# Patient Record
Sex: Female | Born: 1960 | Race: White | Hispanic: No | Marital: Married | Smoking: Never smoker
Health system: Southern US, Community
[De-identification: ages and names within clinical notes are randomized; demographics above are authoritative.]

## PROBLEM LIST (undated history)

## (undated) DIAGNOSIS — D869 Sarcoidosis, unspecified: Secondary | ICD-10-CM

## (undated) DIAGNOSIS — M797 Fibromyalgia: Secondary | ICD-10-CM

## (undated) DIAGNOSIS — Z8739 Personal history of other diseases of the musculoskeletal system and connective tissue: Secondary | ICD-10-CM

## (undated) DIAGNOSIS — N938 Other specified abnormal uterine and vaginal bleeding: Secondary | ICD-10-CM

## (undated) DIAGNOSIS — N870 Mild cervical dysplasia: Secondary | ICD-10-CM

## (undated) DIAGNOSIS — L409 Psoriasis, unspecified: Secondary | ICD-10-CM

## (undated) DIAGNOSIS — N921 Excessive and frequent menstruation with irregular cycle: Secondary | ICD-10-CM

## (undated) DIAGNOSIS — I889 Nonspecific lymphadenitis, unspecified: Secondary | ICD-10-CM

## (undated) DIAGNOSIS — Z332 Encounter for elective termination of pregnancy: Secondary | ICD-10-CM

## (undated) HISTORY — PX: PELVIC LAPAROSCOPY: SHX162

## (undated) HISTORY — DX: Encounter for elective termination of pregnancy: Z33.2

## (undated) HISTORY — DX: Excessive and frequent menstruation with irregular cycle: N92.1

## (undated) HISTORY — DX: Psoriasis, unspecified: L40.9

## (undated) HISTORY — PX: MOUTH SURGERY: SHX715

## (undated) HISTORY — DX: Other specified abnormal uterine and vaginal bleeding: N93.8

## (undated) HISTORY — DX: Nonspecific lymphadenitis, unspecified: I88.9

## (undated) HISTORY — PX: CRYOABLATION: SHX1415

## (undated) HISTORY — DX: Mild cervical dysplasia: N87.0

## (undated) HISTORY — DX: Personal history of other diseases of the musculoskeletal system and connective tissue: Z87.39

---

## 2000-02-01 ENCOUNTER — Encounter: Admission: RE | Admit: 2000-02-01 | Discharge: 2000-02-01 | Payer: Self-pay | Admitting: Family Medicine

## 2000-02-01 ENCOUNTER — Encounter: Payer: Self-pay | Admitting: Family Medicine

## 2001-12-17 ENCOUNTER — Encounter: Payer: Self-pay | Admitting: Family Medicine

## 2001-12-17 ENCOUNTER — Encounter: Admission: RE | Admit: 2001-12-17 | Discharge: 2001-12-17 | Payer: Self-pay | Admitting: Family Medicine

## 2005-06-04 ENCOUNTER — Encounter: Admission: RE | Admit: 2005-06-04 | Discharge: 2005-06-04 | Payer: Self-pay | Admitting: Family Medicine

## 2007-01-06 ENCOUNTER — Encounter: Admission: RE | Admit: 2007-01-06 | Discharge: 2007-01-06 | Payer: Self-pay | Admitting: Nurse Practitioner

## 2008-02-18 ENCOUNTER — Encounter: Admission: RE | Admit: 2008-02-18 | Discharge: 2008-02-18 | Payer: Self-pay | Admitting: Family Medicine

## 2010-01-10 ENCOUNTER — Other Ambulatory Visit: Admission: RE | Admit: 2010-01-10 | Discharge: 2010-01-10 | Payer: Self-pay | Admitting: Gynecology

## 2010-01-10 ENCOUNTER — Ambulatory Visit: Payer: Self-pay | Admitting: Gynecology

## 2010-01-18 ENCOUNTER — Ambulatory Visit: Payer: Self-pay | Admitting: Gynecology

## 2010-01-19 ENCOUNTER — Ambulatory Visit: Payer: Self-pay | Admitting: Gynecology

## 2010-03-06 ENCOUNTER — Encounter: Admission: RE | Admit: 2010-03-06 | Discharge: 2010-03-06 | Payer: Self-pay | Admitting: Family Medicine

## 2010-03-23 ENCOUNTER — Encounter: Admission: RE | Admit: 2010-03-23 | Discharge: 2010-03-23 | Payer: Self-pay | Admitting: Family Medicine

## 2010-03-30 ENCOUNTER — Ambulatory Visit: Payer: Self-pay | Admitting: Gynecology

## 2010-05-31 ENCOUNTER — Ambulatory Visit
Admission: RE | Admit: 2010-05-31 | Discharge: 2010-05-31 | Payer: Self-pay | Source: Home / Self Care | Attending: Gynecology | Admitting: Gynecology

## 2010-06-01 ENCOUNTER — Ambulatory Visit
Admission: RE | Admit: 2010-06-01 | Discharge: 2010-06-01 | Payer: Self-pay | Source: Home / Self Care | Attending: Gynecology | Admitting: Gynecology

## 2010-06-01 HISTORY — PX: ENDOMETRIAL ABLATION: SHX621

## 2010-06-10 ENCOUNTER — Encounter: Payer: Self-pay | Admitting: Nurse Practitioner

## 2010-06-19 ENCOUNTER — Ambulatory Visit
Admission: RE | Admit: 2010-06-19 | Discharge: 2010-06-19 | Payer: Self-pay | Source: Home / Self Care | Attending: Gynecology | Admitting: Gynecology

## 2010-07-31 ENCOUNTER — Ambulatory Visit (INDEPENDENT_AMBULATORY_CARE_PROVIDER_SITE_OTHER): Payer: BC Managed Care – PPO | Admitting: Gynecology

## 2010-07-31 DIAGNOSIS — N925 Other specified irregular menstruation: Secondary | ICD-10-CM

## 2010-07-31 DIAGNOSIS — N949 Unspecified condition associated with female genital organs and menstrual cycle: Secondary | ICD-10-CM

## 2010-09-19 ENCOUNTER — Ambulatory Visit
Admission: RE | Admit: 2010-09-19 | Discharge: 2010-09-19 | Disposition: A | Discharge status: Home / Self Care | Payer: BC Managed Care – PPO | Source: Ambulatory Visit | Attending: Family Medicine | Admitting: Family Medicine

## 2010-09-19 ENCOUNTER — Other Ambulatory Visit: Payer: Self-pay | Admitting: Family Medicine

## 2010-09-19 DIAGNOSIS — R202 Paresthesia of skin: Secondary | ICD-10-CM

## 2010-09-19 DIAGNOSIS — R51 Headache: Secondary | ICD-10-CM

## 2010-09-19 DIAGNOSIS — H53129 Transient visual loss, unspecified eye: Secondary | ICD-10-CM

## 2010-09-24 ENCOUNTER — Other Ambulatory Visit: Payer: Self-pay | Admitting: Family Medicine

## 2010-09-24 DIAGNOSIS — R9389 Abnormal findings on diagnostic imaging of other specified body structures: Secondary | ICD-10-CM

## 2010-09-25 ENCOUNTER — Other Ambulatory Visit: Payer: BC Managed Care – PPO

## 2010-09-27 ENCOUNTER — Ambulatory Visit
Admission: RE | Admit: 2010-09-27 | Discharge: 2010-09-27 | Disposition: A | Discharge status: Home / Self Care | Payer: BC Managed Care – PPO | Source: Ambulatory Visit | Attending: Family Medicine | Admitting: Family Medicine

## 2010-09-27 DIAGNOSIS — R9389 Abnormal findings on diagnostic imaging of other specified body structures: Secondary | ICD-10-CM

## 2010-10-16 ENCOUNTER — Encounter: Payer: Self-pay | Admitting: Emergency Medicine

## 2010-10-17 ENCOUNTER — Other Ambulatory Visit (INDEPENDENT_AMBULATORY_CARE_PROVIDER_SITE_OTHER): Payer: BC Managed Care – PPO

## 2010-10-17 ENCOUNTER — Ambulatory Visit (INDEPENDENT_AMBULATORY_CARE_PROVIDER_SITE_OTHER): Payer: BC Managed Care – PPO | Admitting: Emergency Medicine

## 2010-10-17 ENCOUNTER — Encounter: Payer: Self-pay | Admitting: Emergency Medicine

## 2010-10-17 DIAGNOSIS — R0609 Other forms of dyspnea: Secondary | ICD-10-CM

## 2010-10-17 DIAGNOSIS — R918 Other nonspecific abnormal finding of lung field: Secondary | ICD-10-CM

## 2010-10-17 DIAGNOSIS — R0989 Other specified symptoms and signs involving the circulatory and respiratory systems: Secondary | ICD-10-CM

## 2010-10-17 DIAGNOSIS — R0600 Dyspnea, unspecified: Secondary | ICD-10-CM

## 2010-10-17 LAB — CBC
HCT: 43.4 % (ref 36.0–46.0)
Hemoglobin: 13.7 g/dL (ref 12.0–15.0)
MCH: 28.5 pg (ref 26.0–34.0)
MCHC: 31.6 g/dL (ref 30.0–36.0)
MCV: 90.4 fL (ref 78.0–100.0)
Platelets: 271 10*3/uL (ref 150–400)
RBC: 4.8 MIL/uL (ref 3.87–5.11)
RDW: 13.7 % (ref 11.5–15.5)
WBC: 11.8 10*3/uL — ABNORMAL HIGH (ref 4.0–10.5)

## 2010-10-17 LAB — PROTIME-INR
INR: 1 ratio (ref 0.8–1.0)
Prothrombin Time: 11.3 s (ref 10.2–12.4)

## 2010-10-17 NOTE — Progress Notes (Signed)
Subjective:     Patient ID: Phyllis Kim, female   DOB: 04/18/1961, 50 y.o.   MRN: [id number]  HPI 50 yo never smoker, hx psoriasis, referred for SOB and abnormal CT scan of the chest. Works as a Runner, broadcasting/film/video.  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 A CXR was done that showed possible nodule, led to CT scan of the chest that shows a calcified R node, other scattered small nodes, some mediastinal LAD. She had a rash that involved her LE and then spread to buttocks and back - treated w pred and improved. She got Israel Pigs in her classroom, ? Related to this. She has water damage in her bathroom with some mold exposure.   Review of Systems  Constitutional: Negative for fever, chills and unexpected weight change.  HENT: Negative for ear pain, nosebleeds, congestion, sore throat, rhinorrhea, sneezing, trouble swallowing, dental problem, voice change, postnasal drip and sinus pressure.   Eyes: Positive for visual disturbance.  Respiratory: Positive for shortness of breath. Negative for cough and choking.   Cardiovascular: Positive for leg swelling. Negative for chest pain.  Gastrointestinal: Negative for vomiting, abdominal pain and diarrhea.  Genitourinary: Negative for difficulty urinating.  Musculoskeletal: Negative for arthralgias.  Skin: Positive for rash.  Neurological: Negative for tremors, syncope and headaches.  Hematological: Does not bruise/bleed easily.   Past Medical History  Diagnosis Date  . Lymphadenitis   . Dysfunctional uterine bleeding   . Menometrorrhagia   . Psoriasis      Family History  Problem Relation Age of Onset  . Emphysema Paternal Grandfather     was a smoker     History   Social History  . Marital Status: Married    Spouse Name: N/A    Number of Children: 3  . Years of Education: N/A   Occupational History  . 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 Smoking status: Never Smoker   . Smokeless tobacco: Never Used  . Alcohol Use: 1.5 - 2.0 oz/week    3-4 drink(s) per week  . Drug Use: Not on file  . Sexually Active: Not on file   Other Topics Concern  . Not on file   Social History Narrative   Lived most of her life in [place], [place]. No known exposure to TB     Allergies  Allergen Reactions  . Amoxicillin      Outpatient Prescriptions Prior to Visit  Medication Sig Dispense Refill  . megestrol (MEGACE) 40 MG tablet Take 40 mg by mouth daily.             Objective:   Physical Exam  Gen: Pleasant, well-nourished, in no distress,  normal affect  ENT: No lesions,  mouth clear,  oropharynx clear, no postnasal drip  Neck: No JVD, no TMG, no carotid bruits  Lungs: No use of accessory muscles, no dullness to percussion, clear without rales or rhonchi  Cardiovascular: RRR, heart sounds normal, no murmur or gallops, no peripheral edema  Abdomen: soft and NT, no HSM,  BS normal  Musculoskeletal: No deformities, no cyanosis or clubbing  Neuro: alert, non focal  Skin: Warm, no lesions or rashes     Assessment:     Pulmonary nodules With mediastinal LAD. The calcifications are reassuring. Could represent sarcoidosis,  HSP, other inflammatory process. The rash and visual changes would fit w sarcoidosis - FOB/EBUS to sample nodes and prob TBBx's - ACE level - serum precipitans - will need optho eval at some point  Dyspnea on exertion Full PFT's

## 2010-10-17 NOTE — Assessment & Plan Note (Signed)
Full PFTs

## 2010-10-17 NOTE — Assessment & Plan Note (Addendum)
With mediastinal LAD. The calcifications are reassuring. Could represent sarcoidosis, HSP, other inflammatory process. The rash and visual changes would fit w sarcoidosis - FOB/EBUS to sample nodes and prob TBBx's - ACE level - serum precipitans - will need optho eval at some point

## 2010-10-17 NOTE — Patient Instructions (Signed)
We will perform an bronchoscopy with ultrasound and biopsies of lung and lymph node tissue We will perform blood work today We will perform full pulmonary function testing Follow up with Dr Delton Coombes next available to discuss results.

## 2010-10-19 LAB — ANGIOTENSIN CONVERTING ENZYME: Angiotensin-Converting Enzyme: 44 U/L (ref 8–52)

## 2010-10-20 LAB — FUNGAL ANTIBODIES PANEL, ID-BLOOD
Aspergillus Flavus Antibodies: NEGATIVE
Aspergillus Niger Antibodies: NEGATIVE
Aspergillus fumigatus: NEGATIVE
Blastomyces Abs, Qn, DID: NEGATIVE
Coccidioides Antibody ID: NEGATIVE
Histoplasma Antibody, ID: NEGATIVE

## 2010-10-22 ENCOUNTER — Telehealth: Payer: Self-pay | Admitting: Emergency Medicine

## 2010-10-22 NOTE — Telephone Encounter (Signed)
Called and spoke with pt.  Pt states she has been waiting on a call from someone regarding directions/instructions for EBUS/FOB.  Informed pt [facility] should call pt to inform her of instructions prior to the procedure.  However, gave pt [facility] # for pt to call tomorrow( they are closed now for the evening)  morning to get instructions. Pt verbalized understanding and stated she will call tomorrow morning.

## 2010-10-24 ENCOUNTER — Other Ambulatory Visit: Payer: Self-pay | Admitting: Emergency Medicine

## 2010-10-24 ENCOUNTER — Ambulatory Visit (HOSPITAL_COMMUNITY): Payer: BC Managed Care – PPO

## 2010-10-24 ENCOUNTER — Ambulatory Visit (HOSPITAL_COMMUNITY)
Admission: RE | Admit: 2010-10-24 | Discharge: 2010-10-24 | Disposition: A | Discharge status: Home / Self Care | Payer: BC Managed Care – PPO | Source: Ambulatory Visit | Attending: Emergency Medicine | Admitting: Emergency Medicine

## 2010-10-24 DIAGNOSIS — R0609 Other forms of dyspnea: Secondary | ICD-10-CM | POA: Insufficient documentation

## 2010-10-24 DIAGNOSIS — R599 Enlarged lymph nodes, unspecified: Secondary | ICD-10-CM | POA: Insufficient documentation

## 2010-10-24 DIAGNOSIS — R911 Solitary pulmonary nodule: Secondary | ICD-10-CM

## 2010-10-24 DIAGNOSIS — J984 Other disorders of lung: Secondary | ICD-10-CM

## 2010-10-24 DIAGNOSIS — L408 Other psoriasis: Secondary | ICD-10-CM | POA: Insufficient documentation

## 2010-10-24 DIAGNOSIS — R0989 Other specified symptoms and signs involving the circulatory and respiratory systems: Secondary | ICD-10-CM | POA: Insufficient documentation

## 2010-10-24 DIAGNOSIS — Z01818 Encounter for other preprocedural examination: Secondary | ICD-10-CM | POA: Insufficient documentation

## 2010-10-24 DIAGNOSIS — Z79899 Other long term (current) drug therapy: Secondary | ICD-10-CM | POA: Insufficient documentation

## 2010-10-24 LAB — CBC
HCT: 37.9 % (ref 36.0–46.0)
Hemoglobin: 12.9 g/dL (ref 12.0–15.0)
MCH: 28.9 pg (ref 26.0–34.0)
MCHC: 34 g/dL (ref 30.0–36.0)
MCV: 85 fL (ref 78.0–100.0)
Platelets: 228 10*3/uL (ref 150–400)
RBC: 4.46 MIL/uL (ref 3.87–5.11)
RDW: 13.1 % (ref 11.5–15.5)
WBC: 8.2 10*3/uL (ref 4.0–10.5)

## 2010-10-24 LAB — PROTIME-INR
INR: 1.03 (ref 0.00–1.49)
Prothrombin Time: 13.7 seconds (ref 11.6–15.2)

## 2010-10-24 LAB — SURGICAL PCR SCREEN
MRSA, PCR: NEGATIVE
Staphylococcus aureus: NEGATIVE

## 2010-10-24 LAB — APTT: aPTT: 28 seconds (ref 24–37)

## 2010-10-26 ENCOUNTER — Telehealth: Payer: Self-pay | Admitting: Emergency Medicine

## 2010-10-26 LAB — CULTURE, BAL-QUANTITATIVE W GRAM STAIN
Colony Count: NO GROWTH
Culture: NO GROWTH
Gram Stain: NONE SEEN

## 2010-10-26 NOTE — Telephone Encounter (Signed)
Spoke with pt. She is asking for bx results. I advised that MR out of the office until 6/13 and will forward him msg to let him know she is requesting results. Please advise thanks!

## 2010-10-29 NOTE — Telephone Encounter (Signed)
Spoke to patient, reviewed path and cytology - consistent w granulomatous inflammation, no malignancy. I suspect that this will be either old fungal disease or sarcoidosis. Explained the Ddx to her. We will review cx data in July at [facility]

## 2010-11-26 LAB — FUNGUS CULTURE W SMEAR
Fungal Smear: NONE SEEN
Fungal Smear: NONE SEEN

## 2010-11-27 NOTE — Assessment & Plan Note (Signed)
[facility]                                ON-CALL NOTE  NAME:PECKLinday, Rhodes                         MRN:          [id number] DATE:10/24/2010                            DOB:          August 27, 1960   Ms. Splinter called the on-call line to relate that after eating a bowl of ice cream and then going to bed, she woke up with a headache and threw- up.  Otherwise, she is not feeling any other symptoms.  She denies any abdominal pain, any continued nausea, just feels weak.  She claims she had a fiberoptic bronchoscopy this morning.  She could not remember the sedation and otherwise has no other symptoms particularly fever, chills, sweats, orthostasis.  I instructed that this is not that unusual.  She may have had a transient bacteremia or reaction to the medication.  If she has any other symptoms, call back.  Otherwise, she could report to the emergency room if she has any worsening of her condition.  I will relate this to Dr. Delton Coombes.    Rogelia Rohrer, MD Electronically Signed   Clemetine Marker  DD: 10/24/2010  DT: 10/25/2010  Job #: [id number]

## 2010-12-05 ENCOUNTER — Ambulatory Visit (INDEPENDENT_AMBULATORY_CARE_PROVIDER_SITE_OTHER): Payer: BC Managed Care – PPO | Admitting: Emergency Medicine

## 2010-12-05 ENCOUNTER — Encounter: Payer: Self-pay | Admitting: Emergency Medicine

## 2010-12-05 VITALS — BP 110/68 | HR 92 | Temp 97.7°F | Ht 66.0 in | Wt 191.0 lb

## 2010-12-05 DIAGNOSIS — R0600 Dyspnea, unspecified: Secondary | ICD-10-CM

## 2010-12-05 DIAGNOSIS — R918 Other nonspecific abnormal finding of lung field: Secondary | ICD-10-CM

## 2010-12-05 DIAGNOSIS — R0609 Other forms of dyspnea: Secondary | ICD-10-CM

## 2010-12-05 DIAGNOSIS — R0989 Other specified symptoms and signs involving the circulatory and respiratory systems: Secondary | ICD-10-CM

## 2010-12-05 LAB — PULMONARY FUNCTION TEST

## 2010-12-05 NOTE — Progress Notes (Signed)
Subjective:     Patient ID: Phyllis Kim, female   DOB: 11/01/1960, 50 y.o.   MRN: [id number]  HPI 50 yo never smoker, hx psoriasis, referred for SOB and abnormal CT scan of the chest. Works as a Runner, broadcasting/film/video.  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 A CXR was done that showed possible nodule, led to CT scan of the chest that shows a calcified R node, other scattered small nodes, some mediastinal LAD. She had a rash that involved her LE and then spread to buttocks and back - treated w pred and improved. She got Israel Pigs in her classroom, ? Related to this. She has water damage in her bathroom with some mold exposure.   ROV 12/05/10 -- follows up post FOB. Wang bx show granulomas, cx's all negative so far. PFT done today - Normal airflows, Normal TLC, DLCO corrects to normal range. She has been doing well, no complaints, no rash, no breathing difficulties.   Review of Systems  Constitutional: Negative for fever, chills and unexpected weight change.  HENT: Negative for ear pain, nosebleeds, congestion, sore throat, rhinorrhea, sneezing, trouble swallowing, dental problem, voice change, postnasal drip and sinus pressure.   Eyes: Positive for visual disturbance.  Respiratory: Positive for shortness of breath. Negative for cough and choking.   Cardiovascular: Positive for leg swelling. Negative for chest pain.  Gastrointestinal: Negative for vomiting, abdominal pain and diarrhea.  Genitourinary: Negative for difficulty urinating.  Musculoskeletal: Negative for arthralgias.  Skin: Positive for rash.  Neurological: Negative for tremors, syncope and headaches.  Hematological: Does not bruise/bleed easily.   Past Medical History  Diagnosis Date  . Lymphadenitis   . Dysfunctional uterine bleeding   . Menometrorrhagia   . Psoriasis      Family History  Problem Relation Age of Onset  . Emphysema Paternal Grandfather     was  a smoker     History   Social History  . Marital Status: Married    Spouse Name: N/A    Number of Children: 3  . Years of Education: N/A   Occupational History  . 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 Smoking status: Never Smoker   . Smokeless tobacco: Never Used  . Alcohol Use: 1.5 - 2.0 oz/week    3-4 drink(s) per week  . Drug Use: Not on file  . Sexually Active: Not on file   Other Topics Concern  . Not on file   Social History Narrative   Lived most of [place] life in [place], [place]. No known exposure to TB     Allergies  Allergen Reactions  . Amoxicillin      Outpatient Prescriptions Prior to Visit  Medication Sig Dispense Refill  . megestrol (MEGACE) 40 MG tablet Take 40 mg by mouth daily.             Objective:   Physical Exam  Gen: Pleasant, well-nourished, in no distress,  normal affect  ENT: No lesions,  mouth clear,  oropharynx clear, no postnasal drip  Neck: No JVD, no TMG, no carotid bruits  Lungs: No use of accessory muscles, no dullness to percussion, clear without rales or rhonchi  Cardiovascular: RRR, heart sounds normal, no murmur or gallops, no peripheral edema  Abdomen: soft and NT, no HSM,  BS normal  Musculoskeletal: No deformities, no cyanosis or clubbing  Neuro: alert, non focal  Skin: Warm, no lesions or rashes     Assessment:     Pulmonary nodules With mediastinal LAD. The calcifications are reassuring. Could represent sarcoidosis, HSP, other inflammatory process. The rash and visual changes would fit w sarcoidosis - FOB/EBUS to sample nodes and prob TBBx's - ACE level - serum precipitans - will need optho eval at some point  Dyspnea on exertion Full PFT's

## 2010-12-05 NOTE — Patient Instructions (Signed)
Your biopsies show evidence for sarcoidosis Your cultures are all negative Please follow up with Dr Delton Coombes in 6 months with a CXR, or sooner if you have any new problems.  Mention sarcoidosis to your opthomolgist.

## 2010-12-05 NOTE — Op Note (Signed)
NAMECAMYA, HAYDON NO.:  [id number]  MEDICAL RECORD NO.:  [id number]  LOCATION:  XRAY                         FACILITY:  [facility]  PHYSICIAN:  Leslye Peer, MD    DATE OF BIRTH:  Nov 23, 1960  DATE OF PROCEDURE:  10/24/2010 DATE OF DISCHARGE:  10/24/2010                              OPERATIVE REPORT   PROCEDURE:  Video fiberoptic bronchoscopy with endobronchial ultrasound, needle biopsies, and transbronchial biopsies.  OPERATOR:  Leslye Peer, MD  INDICATION:  Mediastinal lymphadenopathy and pulmonary nodules.  MEDICATIONS GIVEN:  The procedure was done under general anesthesia with endotracheal intubation.  CONSENT:  Informed consent was obtained from the patient and a signed copy was on her hospital chart.  PROCEDURE DETAILS:  After informed consent was obtained, the patient was taken to the operating room and general anesthesia was initiated with endotracheal intubation.  A formal time-out was performed and then a standard bronchoscope was introduced to the patient's endotracheal tube to visualize the airways.  The main carina was sharp without any abnormality and airway inspection of the right showed normal right mainstem bronchus.  The right upper lobe was normal in appearance with good visualization of the segmental bronchi.  The bronchus intermedius, right middle lobe, and right lower lobe were all similarly within normal limits.  There were no endobronchial lesions or abnormal secretions seen.  Attention was then turned to the left-sided exam.  The left mainstem, left upper lobe lingular, and left lower lobe airways were all inspected.  Again these were all completely normal in appearance without any endobronchial lesions or abnormal secretions.  The standard bronchoscope was removed and endobronchial ultrasound was introduced. Using real time ultrasound guidance, we were able to localize and then perform Wang needle biopsies of the patient's  mediastinal lymph nodes and this was done in three locations at station 4R, station 4L, and station 7.  These samples will be sent for cytology and microbiology. The ultrasound images showed some septations and organization in the left nose, especially in the station 7 lymph node with some possible calcification visualized.  The endobronchial ultrasound was then withdrawn and a standard bronchoscope was reintroduced.  Under fluoroscopic guidance, random transbronchial forceps biopsies were performed, first from the right upper lobe and then from the right lower lobe.  These will be sent for pathology.  Finally a bronchoalveolar lavage was performed in the right middle lobe to be sent for microbiology and cytology.  The patient tolerated the procedure well. There was minimal blood loss and she returned to the recovery room in good condition after her anesthesia was reversed.  SAMPLES: 1. Wang needle biopsy from station 4L lymph node. 2. Wang needle biopsy of station 4R lymph node. 3. Wang needle biopsy from station 7 lymph node. 4. Transbronchial biopsies from the right upper lobe. 5. Transbronchial biopsies from the right lower lobe. 6. Bronchoalveolar lavage from the right middle lobe.  PLANS:  We will follow up the cytology, microbiology, and pathology results with Ms. Carranza when they become available.     Leslye Peer, MD     RSB/MEDQ  D:  10/31/2010  T:  11/01/2010  Job:  [id number]  Electronically Signed by Levy Pupa MD on 12/05/2010 09:44:13 PM

## 2010-12-05 NOTE — Progress Notes (Signed)
PFT done today. 

## 2010-12-05 NOTE — Assessment & Plan Note (Signed)
bx's consistent w sarcoidosis, fungal panel negative, cx's negative, no evidence active disease - f/u in 6 mo with CXR - optho exam

## 2010-12-06 LAB — AFB CULTURE WITH SMEAR (NOT AT ARMC)
Acid Fast Smear: NONE SEEN
Acid Fast Smear: NONE SEEN

## 2012-01-13 ENCOUNTER — Other Ambulatory Visit: Payer: Self-pay | Admitting: Family Medicine

## 2012-01-13 DIAGNOSIS — Z1231 Encounter for screening mammogram for malignant neoplasm of breast: Secondary | ICD-10-CM

## 2012-02-26 ENCOUNTER — Ambulatory Visit
Admission: RE | Admit: 2012-02-26 | Discharge: 2012-02-26 | Disposition: A | Discharge status: Home / Self Care | Payer: BC Managed Care – PPO | Source: Ambulatory Visit | Attending: Family Medicine | Admitting: Family Medicine

## 2012-02-26 DIAGNOSIS — Z1231 Encounter for screening mammogram for malignant neoplasm of breast: Secondary | ICD-10-CM

## 2012-02-28 ENCOUNTER — Other Ambulatory Visit: Payer: Self-pay | Admitting: Family Medicine

## 2012-02-28 DIAGNOSIS — R928 Other abnormal and inconclusive findings on diagnostic imaging of breast: Secondary | ICD-10-CM

## 2012-03-05 ENCOUNTER — Ambulatory Visit
Admission: RE | Admit: 2012-03-05 | Discharge: 2012-03-05 | Disposition: A | Discharge status: Home / Self Care | Payer: BC Managed Care – PPO | Source: Ambulatory Visit | Attending: Family Medicine | Admitting: Family Medicine

## 2012-03-05 DIAGNOSIS — R928 Other abnormal and inconclusive findings on diagnostic imaging of breast: Secondary | ICD-10-CM

## 2013-03-07 IMAGING — CT CT HEAD W/O CM
2 series · 15 of 30 positions shown, 19 images · non-contrast
Comparison: None.

CLINICAL DATA: 49-year-old female with headache, numbness and
tingling in the upper extremities with visual changes.

CT HEAD WITHOUT CONTRAST
TECHNIQUE: Contiguous axial images were obtained from the base of
the skull through the vertex without contrast.

[Series 2: head w/o · axial · non-contrast · 0.49mm/px · z∈[+26,+166]mm · 13 of 32 slices shown, 17 images]
[im 3/32  brain]
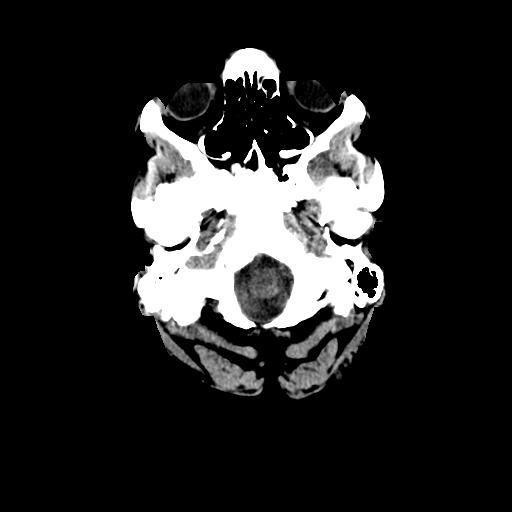
[im 3/32  bone]
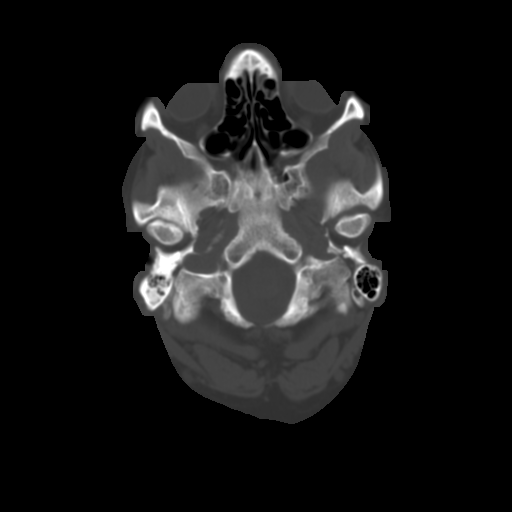
[im 5/32  brain]
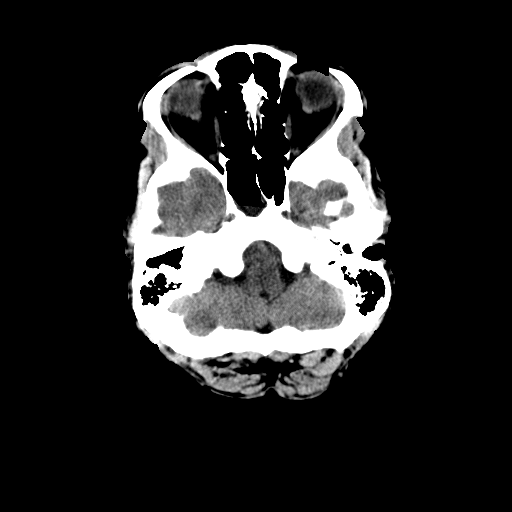
[im 7/32  brain]
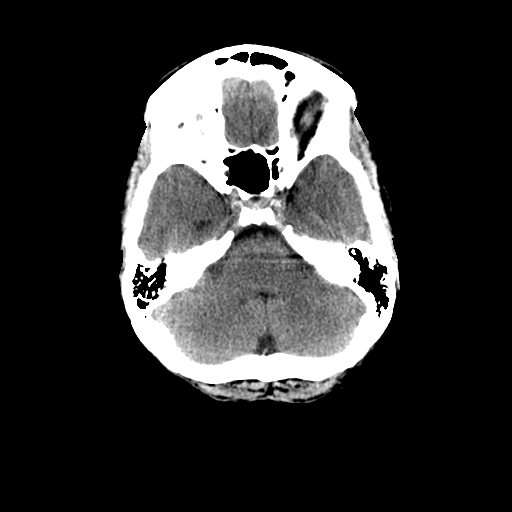
[im 9/32  brain]
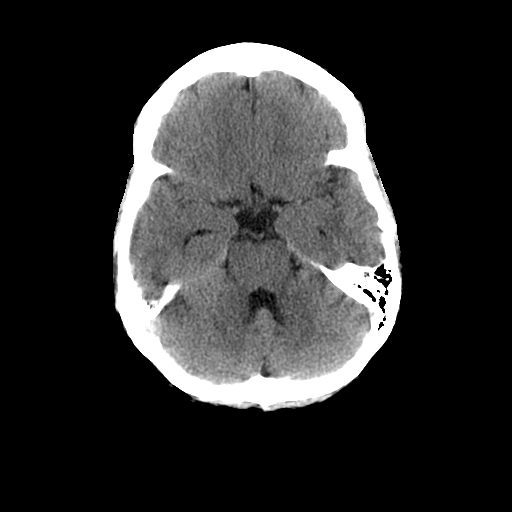
[im 12/32  brain]
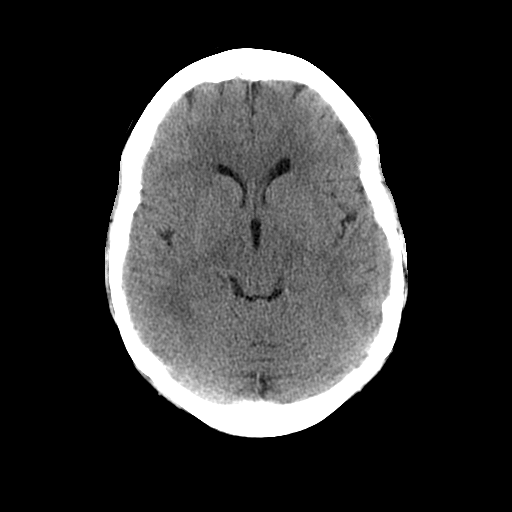
[im 12/32  bone]
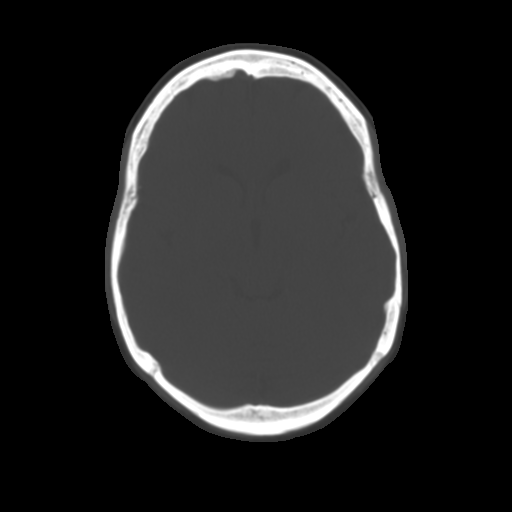
[im 14/32  brain]
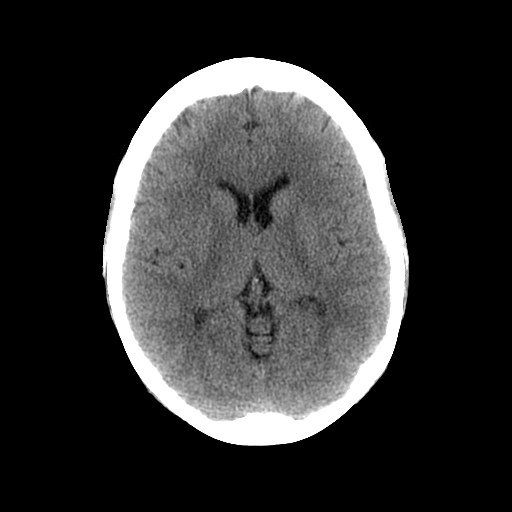
[im 16/32  brain]
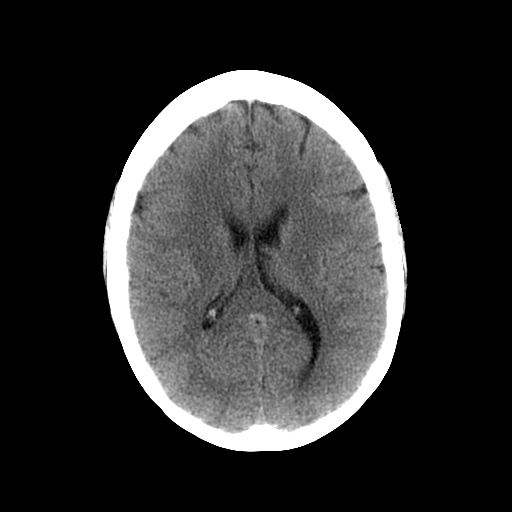
[im 18/32  brain]
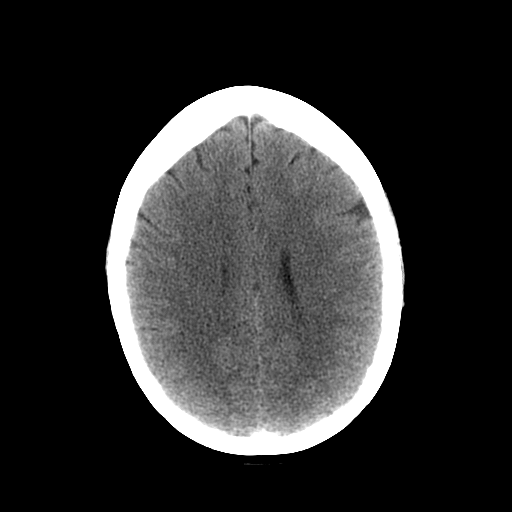
[im 20/32  brain]
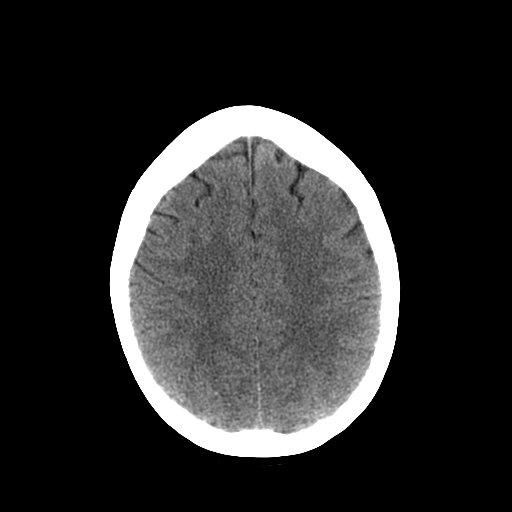
[im 20/32  bone]
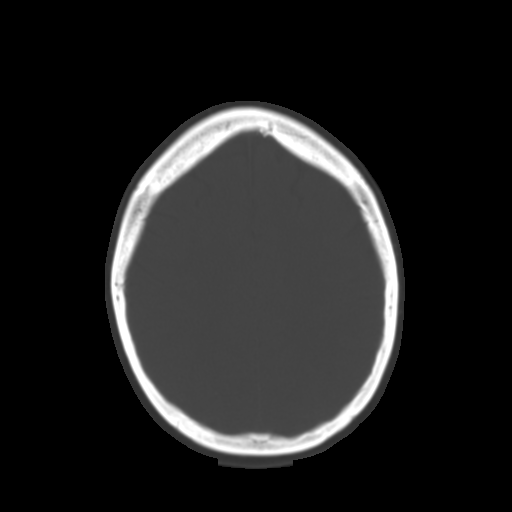
[im 23/32  brain]
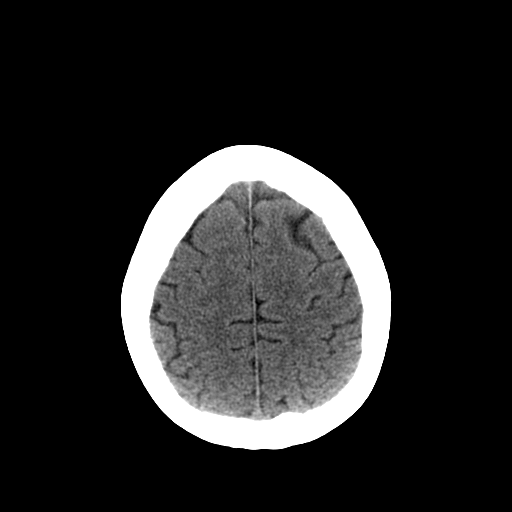
[im 25/32  brain]
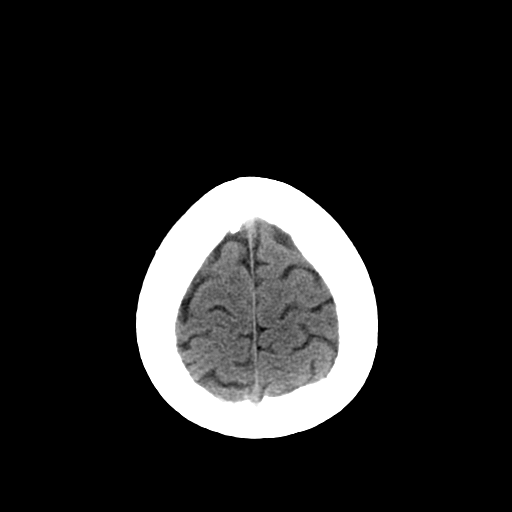
[im 27/32  brain]
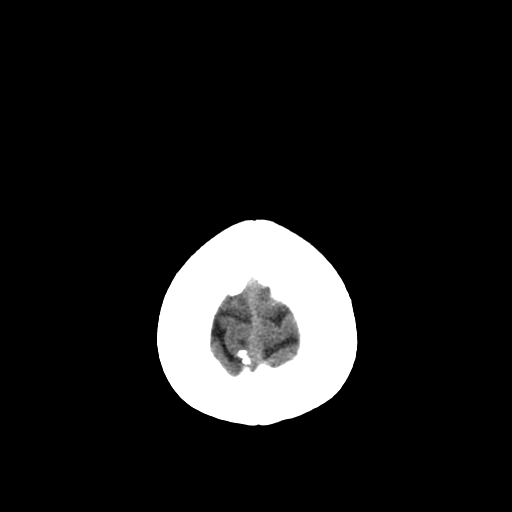
[im 29/32  brain]
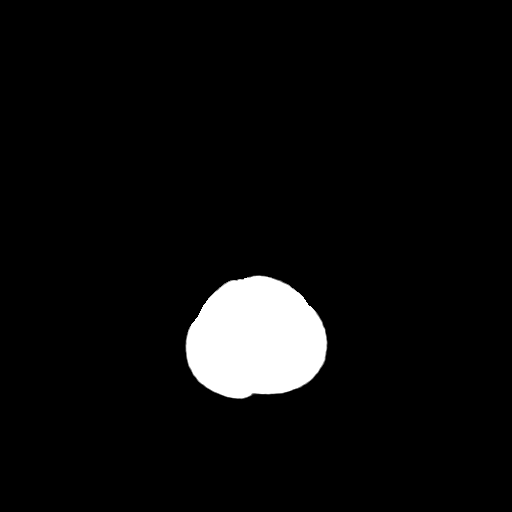
[im 29/32  bone]
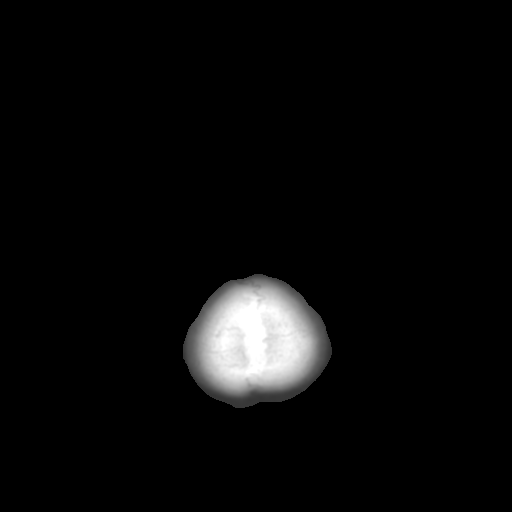

[Series 3: head bone · axial · 0.49mm/px · z∈[+26,+48]mm · 2 of 32 slices shown]
[im 3/32  bone]
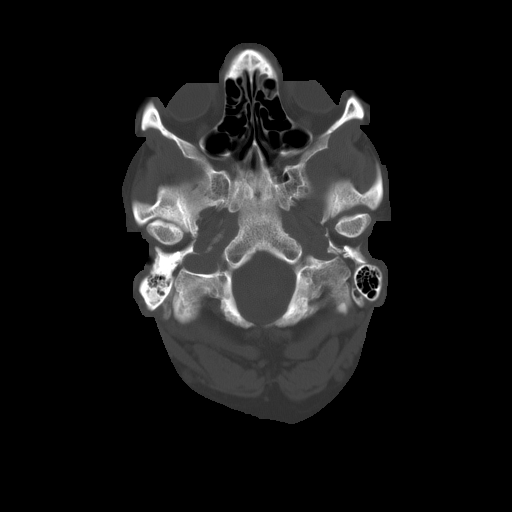
[im 7/32  bone]
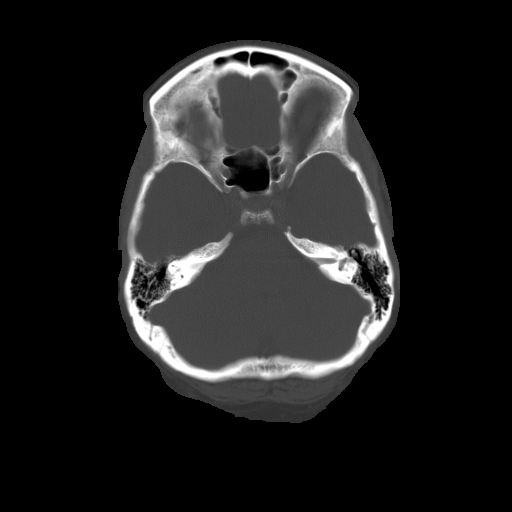

[15 of 30 positions shown; findings below may reference images not displayed]

FINDINGS: Visualized orbit soft tissues are within normal limits.
Visualized scalp soft tissues are within normal limits.  Mild
mucosal thickening right sphenoid sinus.  Other Visualized
paranasal sinuses and mastoids are clear.  No acute osseous
abnormality identified.

Cerebral volume is within normal limits for age.  No midline shift,
ventriculomegaly, mass effect, evidence of mass lesion,
intracranial hemorrhage or evidence of cortically based acute
infarction.  Gray-white matter differentiation is within normal
limits throughout the brain.  No suspicious intracranial vascular
hyperdensity.
IMPRESSION: Normal noncontrast CT appearance of the brain.

## 2014-07-12 ENCOUNTER — Other Ambulatory Visit: Payer: Self-pay | Admitting: Family Medicine

## 2014-07-12 DIAGNOSIS — R921 Mammographic calcification found on diagnostic imaging of breast: Secondary | ICD-10-CM

## 2014-08-03 ENCOUNTER — Ambulatory Visit
Admission: RE | Admit: 2014-08-03 | Discharge: 2014-08-03 | Disposition: A | Discharge status: Home / Self Care | Payer: BC Managed Care – PPO | Source: Ambulatory Visit | Attending: Family Medicine | Admitting: Family Medicine

## 2014-08-03 DIAGNOSIS — R921 Mammographic calcification found on diagnostic imaging of breast: Secondary | ICD-10-CM

## 2014-08-23 ENCOUNTER — Ambulatory Visit: Payer: Self-pay | Admitting: Gynecology

## 2014-09-06 ENCOUNTER — Ambulatory Visit: Payer: Self-pay | Admitting: Gynecology

## 2014-09-27 ENCOUNTER — Encounter: Payer: Self-pay | Admitting: Gynecology

## 2014-09-27 ENCOUNTER — Ambulatory Visit (INDEPENDENT_AMBULATORY_CARE_PROVIDER_SITE_OTHER): Payer: BC Managed Care – PPO | Admitting: Gynecology

## 2014-09-27 VITALS — BP 126/84 | Ht 66.0 in | Wt 190.0 lb

## 2014-09-27 DIAGNOSIS — R87612 Low grade squamous intraepithelial lesion on cytologic smear of cervix (LGSIL): Secondary | ICD-10-CM

## 2014-09-27 NOTE — Progress Notes (Signed)
   Patient is a 54 year old who was referred to our practice as a courtesy of patient's primary care provider Baxter HireKristen Kaplan/Dr. Andrey CampanileWilson as a result of an abnormal Pap smear in February of this year whereby patient brought her office notes it it indicated the following:  Diagnosis: Epithelial cell abnormality Low-grade squamous intraepithelial lesion (LGSIL); human papilloma virus effect is present  Patient denies any prior history of any abnormal Pap smears. She is menopausal and having very few of any vasomotor symptoms. She states her mammograms are up-to-date but she still has not schedule colonoscopy. Several years ago patient had an endometrial ablation in the office.  Colposcopic evaluation: Patient underwent a detail colposcopic evaluation of the external genitalia to include the perineum and perirectal region with no lesions seen. The speculum was then introduced into the vagina and in a systematic fashion the entire vagina, fornix and cervix were inspected. Acetic acid was applied and the only area seen was at the 4:00 position a questionable acetowhite flat area which was biopsied. Endocervical speculum did demonstrate full visualization of the transformation zone. After this an ECC was obtained. Monsel solution was used for hemostasis.  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 Questionable small area at the 4:00 position was biopsied along with an ECC. Will await the results. We discussed the new guidelines from the ASCCP which recommends follow-up with Pap smear and HPV screen in 12 months unless higher grade lesion is reported from this biopsy report. A pamphlet outlining all the above was provided to the patient.

## 2014-09-30 ENCOUNTER — Encounter: Payer: Self-pay | Admitting: Gynecology

## 2016-02-29 ENCOUNTER — Other Ambulatory Visit: Payer: Self-pay | Admitting: Family Medicine

## 2016-02-29 DIAGNOSIS — Z1231 Encounter for screening mammogram for malignant neoplasm of breast: Secondary | ICD-10-CM

## 2016-03-13 ENCOUNTER — Ambulatory Visit
Admission: RE | Admit: 2016-03-13 | Discharge: 2016-03-13 | Disposition: A | Discharge status: Home / Self Care | Payer: BC Managed Care – PPO | Source: Ambulatory Visit | Attending: Family Medicine | Admitting: Family Medicine

## 2016-03-13 DIAGNOSIS — Z1231 Encounter for screening mammogram for malignant neoplasm of breast: Secondary | ICD-10-CM

## 2016-10-02 ENCOUNTER — Encounter: Payer: Self-pay | Admitting: Gynecology

## 2017-03-22 ENCOUNTER — Emergency Department: Payer: BC Managed Care – PPO

## 2017-03-22 ENCOUNTER — Other Ambulatory Visit: Payer: Self-pay

## 2017-03-22 ENCOUNTER — Emergency Department
Admission: EM | Admit: 2017-03-22 | Discharge: 2017-03-22 | Disposition: A | Discharge status: Home / Self Care | Payer: BC Managed Care – PPO | Attending: Emergency Medicine | Admitting: Emergency Medicine

## 2017-03-22 DIAGNOSIS — S62111A Displaced fracture of triquetrum [cuneiform] bone, right wrist, initial encounter for closed fracture: Secondary | ICD-10-CM | POA: Insufficient documentation

## 2017-03-22 DIAGNOSIS — S52611A Displaced fracture of right ulna styloid process, initial encounter for closed fracture: Secondary | ICD-10-CM | POA: Insufficient documentation

## 2017-03-22 DIAGNOSIS — W109XXA Fall (on) (from) unspecified stairs and steps, initial encounter: Secondary | ICD-10-CM | POA: Insufficient documentation

## 2017-03-22 DIAGNOSIS — D1721 Benign lipomatous neoplasm of skin and subcutaneous tissue of right arm: Secondary | ICD-10-CM | POA: Insufficient documentation

## 2017-03-22 DIAGNOSIS — S62101A Fracture of unspecified carpal bone, right wrist, initial encounter for closed fracture: Secondary | ICD-10-CM

## 2017-03-22 DIAGNOSIS — M797 Fibromyalgia: Secondary | ICD-10-CM | POA: Insufficient documentation

## 2017-03-22 DIAGNOSIS — S52571A Other intraarticular fracture of lower end of right radius, initial encounter for closed fracture: Secondary | ICD-10-CM | POA: Insufficient documentation

## 2017-03-22 MED ORDER — ONDANSETRON HCL 4 MG/2ML IJ SOLN
4.0000 mg | Freq: Once | INTRAMUSCULAR | Status: AC
Start: 2017-03-22 — End: 2017-03-22
  Administered 2017-03-22: 03:00:00 4 mg via INTRAVENOUS
  Filled 2017-03-22: qty 2

## 2017-03-22 MED ORDER — HYDROCODONE-ACETAMINOPHEN 5-325 MG PO TABS
1.0000 | ORAL_TABLET | Freq: Once | ORAL | Status: DC
Start: 2017-03-22 — End: 2017-03-22

## 2017-03-22 MED ORDER — IBUPROFEN 400 MG PO TABS
800.0000 mg | ORAL_TABLET | Freq: Once | ORAL | Status: AC
Start: 2017-03-22 — End: 2017-03-22
  Administered 2017-03-22: 08:00:00 800 mg via ORAL
  Filled 2017-03-22: qty 2

## 2017-03-22 MED ORDER — MORPHINE SULFATE 4 MG/ML IJ/IV SOLN (WRAP)
4.0000 mg | Freq: Once | Status: AC
Start: 2017-03-22 — End: 2017-03-22
  Administered 2017-03-22: 03:00:00 4 mg via INTRAVENOUS
  Filled 2017-03-22: qty 1

## 2017-03-22 MED ORDER — HYDROCODONE-ACETAMINOPHEN 5-325 MG PO TABS
1.0000 | ORAL_TABLET | Freq: Four times a day (QID) | ORAL | 0 refills | Status: DC | PRN
Start: 2017-03-22 — End: 2017-03-28
  Filled 2017-03-22: qty 10, 3d supply, fill #0

## 2017-03-22 NOTE — ED Notes (Signed)
Patient AOx4, given juice and crackers with ibuprofen. Able to ambulate independently. Husband in TICU, patient was taken by Clerance Lav, RN via wheelchair for comfort and courtesy to husband's bedside at discharge.

## 2017-03-22 NOTE — ED Notes (Signed)
Bed: N T2  Expected date:   Expected time:   Means of arrival:   Comments:

## 2017-03-22 NOTE — ED Provider Notes (Signed)
[facility] EMERGENCY DEPARTMENT H&P                                             ATTENDING SUPERVISORY NOTE             CLINICAL SUMMARY          Diagnosis:    .     Final diagnoses:   Closed fracture of right wrist, initial encounter         MDM Notes:      X-ray shows fracture of right wrist with mild angulation.  Patient evaluated by orthopedic surgery neurovascularly intact.  Discharge home with follow-up after reduction by orthopedic surgery and subsequent review of postreduction films.    Based on the patient's clinical presentation, vital signs, and diagnostic studies, the patient is safe for discharge. The work up has shown no signs of life-threatening emergency. The patient understands that follow up is needed, and if unable to do this - the patient should return to the ER for a repeat evaluation.    The patient/family understands their instructions and the patient is well-appearing at time of discharge. I explained results and discharge instructions to the patient/family. The patient/family acknowledges understanding and agrees with plan.            Disposition:      ED Disposition     None                    ATTENDING NOTE      The patient was seen and examined by the mid-level (physician's assistant or nurse practitioner), resident or fellow, and the plan of care was discussed with me. I agree with the plan as it was presented to me.  I have reviewed and agree with the final ED diagnosis.    I spoke to and examined the patient as well: Yes  I was present during key portions of any procedures performed: N/A    Misty Fields is a 56 y.o. female who presents with sudden onset moderate pain in RT wrist and RT hip. Pt was helping husband walk down stairs when husband fell, bringing her down with him. She fell down approximately 7 steps landing on her RT side. She bumped her head on a door. Denies HA, LOC, vision changes, emesis, neck pain.    REVIEW OF SYSTEMS: Positive and  negative ROS elements as per HPI.  All other systems reviewed and negative.    Constitutional: Vital signs reviewed. Well appearing.  Head: Normocephalic, atraumatic  Eyes: No conjunctival injection. No discharge. PERRL, EOMI  ENT: Mucous membranes moist  Neck: Normal range of motion. Trachea midline. No cervical spine tenderness  Respiratory/Chest: No dyspnea or work of breathing.  Back: no CVA tenderness, no T-L spine tenderness, Upper Extremities: No cyanosis. Moderate swelling and deformity to right wrist, radial pulse 2+  Lower Extremities: No edema. No cyanosis. Full range of motion and pelvis able to range bilateral lower tremors or difficulty no pelvic instability  Neurological: Alert Normal and symmetric motor tone by observation. Speech normal. Memory Normal. No facial assymetry.  Skin: Warm and dry. No rash.  Psychiatric: Normal affect. Normal concentration.              VISIT INFORMATION        Clinical Course in the ED:      ED  Course as of Mar 22 257   Sat Mar 22, 2017   0252 Spoke with orthopedic surgery, requesting pain medication for bedside reduction  [SI]      ED Course User Index  [SI] Mel Almond           Medications Given in the ED:    .     ED Medication Orders     Start Ordered     Status Ordering Provider    03/22/17 0254 03/22/17 0253  morphine injection 4 mg  Once     Route: Intravenous  Ordered Dose: 4 mg     Gita Kudo B    03/22/17 0247 03/22/17 0246    Once     Route: Oral  Ordered Dose: 1 tablet     Discontinued Kyrie Bun B            Procedures:      Procedures      Interpretations:      Differential Diagnosis considered by MD (not completely inclusive): fracture, dislocation, contusion, bursitis, hematoma, tendon rupture, strain/sprain    Pulse Ox Analysis: Yes: saturation: 99 %; Oxygen use: room air; Interpretation: Normal     Radiology -  interpreted by me with the following observations: right wrist Colles' fracture      Radiologic study results reviewed by me:  Yes    Radiologic Studies Interpreted (viewed) by me: Yes    Discuss the patient with other providers: Yes    All tests, tracings, EKGs, vital signs and radiological studies above where applicable were interpreted by me, Gracelyn Nurse MD.            PAST HISTORY        Primary Care Provider: Benjie Karvonen, MD        PMH/PSH:    .     Past Medical History:   Diagnosis Date   . H/O fibromyalgia        She has no past surgical history on file.      Social/Family History:      She reports that she has never smoked. She has never used smokeless tobacco. She reports that she drinks alcohol. She reports that she does not use drugs.    No family history on file.      Listed Medications on Arrival:    .     Previous Medications    No medications on file      Allergies: She is allergic to amoxicillin.            RESULTS        Lab Results:      Results     ** No results found for the last 24 hours. **              Radiology Results:      FL Fluoro < 1 Hour   Final Result      1. Fluoroscopy less than one hour.      Olen Pel, MD    03/23/2017 3:49 AM      CT Wrist Right WO Contrast   Final Result      1. Comminuted intra-articular fracture of the right distal radius.   2. Fracture of the ulnar styloid process and chip fracture of the dorsal   triquetrum.   3. 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 This is   not completely evaluated on noncontrast CT scan. This could be further  evaluated by contrast-enhanced MRI.      Lanier Ensign, MD    03/22/2017 4:44 PM      XR Wrist Right 3+ Views   Final Result   : Improved alignment of impacted markedly comminuted   intra-articular distal right radius fracture.      Geanie Cooley, MD    03/22/2017 8:09 AM      Hip Right AP and Lateral with Pelvis   Final Result    Age indeterminate right sacral fracture. Negative for right   hip fracture.      Gerlene Burdock, MD    03/22/2017 3:33 AM      Humerus Right AP and Lateral   Final Result    Negative for acute process       Gerlene Burdock, MD    03/22/2017 3:23 AM      Wrist Right PA and Lateral Oblique   Final Result     Right wrist fractures without dislocation.      Gerlene Burdock, MD    03/22/2017 3:22 AM      Forearm Complete Right   Final Result     Right wrist fractures without dislocation.      Gerlene Burdock, MD    03/22/2017 3:22 AM                  Scribe Attestation:      I was acting as a Neurosurgeon for Dot Been, MD on [facility]    Treatment Team: Scribe: Mel Almond is scribing for me on [facility]. This note and the patient instructions accurately reflect work and decisions made by me.  Dot Been, MD                              Dot Been, MD  03/29/17 [phone]

## 2017-03-22 NOTE — Consults (Signed)
Orthopaedic Consult    Date Time: 03/22/17 5:49 AM  Patient Name: Misty Fabian, MD Attending Physician    Time first seen by orthopaedics: 03/22/2017        Assessment & Plan  Orthopaedic assessment:  56 yo female with right displaced intra-articular distal radius fracture    Reductions/Procedures/Splinting performed (indicate type of Anesthesia used):  Closed reduction performed after a hematoma block applied. Patient placed in well padded splint    Plan:    -Post reduction xray and CT wrist  -elevate RUE  -NWB RUE  -Follow up in clinic in 1 week for surgical scheduling    John Giovanni  Orthopaedic Surgery  Call [phone] with questions/concerns     HPI    Misty Fields is a 56 y.o. year old female.  Orthopaedic consultation has specifically been requested to address this patient's current musculoskeletal presentation. Patient sustained an injury to her right wrist after falling with her husband down a flight of stairs. Noted significant pain and deformity about the wrist, denies any further injuries, denies numbness or tingling in fingers.    Past Medical and Surgical History      Past Medical History:   Diagnosis Date   . H/O fibromyalgia         History reviewed. No pertinent surgical history.    Past Social History & Family History   Social History:   Social History     Social History   . Marital status: Married     Spouse name: N/A   . Number of children: N/A   . Years of education: N/A     Social History Main Topics   . Smoking status: Never Smoker   . Smokeless tobacco: Never Used   . Alcohol use Yes      Comment: socially   . Drug use: No   . Sexual activity: Not on file     Other Topics Concern   . Not on file     Social History Narrative   . No narrative on file       Family History: No family history on file.    Relevant Family & Social  history reviewed.  Any findings relevant to current orthopaedic presentation noted in HPI, otherwise the remainder are noncontributory.       Review of Systems:   MSK noted as above.   GI: No current Nausea/vomiting  ENT: Denies sore throat, epistaxis  CV: Denies chest pain   Resp: No current shortness of breath    Other than mentioned above, there are no Constitutional, Neurological, Psychiatric, ENT, Ophthalmological, Cardiovascular, Respiratory, GI, GU, Musculoskeletal, Integumentary, Lymphatic, Endocrine or Allergic issues.            Home Medications     Prior to Admission medications    Not on File       Allergies     Allergies   Allergen Reactions   . Amoxicillin        Radiology Studies: (actual Orthopaedically relevant films reviewed and read by Orthopaedics)   Displaced intra-articular distal radius fracture                           Physical Exam:     Patient is a 56 y.o. year old female who is alert, well appearing, and in no distress, mood is friendly  Orientation: Fully Oriented    Pulse 71   Temp 97.4  F (36.3 C) (Oral)   Resp 16   Ht 1.676 m (5\' 6" )   Wt 83.9 kg (185 lb)   SpO2 99%   BMI 29.86 kg/m   83.9 kg (185 lb)   1.676 m (5\' 6" )    Gait: not assessed    Heart: regular rate  Lungs: no wheezing      Right Upper Extremity:   Inspection:  swelling and deformity about wrist  Palpation:  Tenderness-severe at fracture site  ROM:  severely limited  Joint Stability: normal and subluxation at wrist  Strength: limited by pain   Skin: normal along wrist and superficial abrasion over posterior elbow   Peripheral Vascular: normal  Sensation: normal  Coordination: Normal      Right Lower Extremity:     Inspection:  No swelling, erythema, deformity, atrophy or hypertrophy noted  Palpation:  Tenderness-none  ROM:  within normal limits  Joint Stability: normal  Strength: normal  Skin: normal   Peripheral Vascular: normal  Sensation: normal  Coordination: Normal     Left Upper Extremity:   Inspection:  No swelling, erythema, deformity, atrophy or hypertrophy noted  Palpation:  Tenderness-none  ROM:  within normal limits  Joint Stability: normal   Strength: normal  Skin: normal   Peripheral Vascular: normal  Sensation: normal  Coordination: Normal    Left Lower Extremity:   Inspection:  No swelling, erythema, deformity, atrophy or hypertrophy noted  Palpation:  Tenderness-none  ROM:  within normal limits  Joint Stability: normal  Strength: normal  Skin: normal   Peripheral Vascular: normal  Sensation: normal  Coordination: Normal     Pelvis:   Skin: normal  Palpation: Tenderness- none  Stability: normal             The review of the patient's medications does not in any way constitute an endorsement, by this clinician,  of their use, dosage, indications, route, efficacy, interactions, or other clinical parameters.    This note was generated within the EP[employer] EMR using Dragon medical speech recognition software and may contain inherent errors or omissions not intended by the user. Grammatical and punctuation errors, random word insertions, deletions, pronoun errors and incomplete sentences are occasional consequences of this technology due to software limitations. Not all errors are caught or corrected.  Although every attempt is made to root out erroneus and incomplete transcription, the note may still not fully represent the intent or opinion of the author. If there are questions or concerns about the content of this note or information contained within the body of this dictation they should be addressed directly with the author for clarification.

## 2017-03-22 NOTE — ED Notes (Signed)
Ortho at bedside for reduction

## 2017-03-22 NOTE — ED Provider Notes (Signed)
Physician/Midlevel provider first contact with patient: 03/22/17 0058         History     Chief Complaint   Patient presents with   . Wrist Pain   . Hip Pain     Patient was helping her husband walk down the stairs.  Husband lost footing and fell bringing patient down with him.  Patient notes she fell down approximately 7 steps.  Landed on right arm and right leg.  Bumped head on laundry room door but denies any loss of consciousness, headache, vision changes or vomiting.  Denies any neck pain.  Complains of pain in right arm, especially right wrist.  Also pain behind right hip, but able to walk.  Patient is right-handed      The history is provided by the patient. No language interpreter was used.   Wrist Pain     Hip Pain              Past Medical History:   Diagnosis Date   . H/O fibromyalgia        History reviewed. No pertinent surgical history.    No family history on file.    Social  Social History   Substance Use Topics   . Smoking status: Never Smoker   . Smokeless tobacco: Never Used   . Alcohol use No       .     Allergies   Allergen Reactions   . Amoxicillin        Home Medications     None on File           Review of Systems   All other systems reviewed and are negative.      Physical Exam    Heart Rate: 71, Temp: 97.4 F (36.3 C), Resp Rate: 16, SpO2: 99 %, Weight: 83.9 kg    Physical Exam   Constitutional: She is oriented to person, place, and time. She appears well-developed and well-nourished. No distress.   HENT:   Head: Normocephalic and atraumatic.   Right Ear: External ear normal.   Left Ear: External ear normal.   Nose: Nose normal.   Eyes: Conjunctivae and EOM are normal. Right eye exhibits no discharge. Left eye exhibits no discharge.   Neck: Normal range of motion and phonation normal. Neck supple. No tracheal deviation present.   Cardiovascular: Normal rate and regular rhythm.    Pulses:       Radial pulses are 2+ on the right side, and 2+ on the left side.   Pulmonary/Chest: Effort  normal. No respiratory distress.   Musculoskeletal: Normal range of motion.   Right wrist: Subtle visible deformity.  Tenderness distal radius; radial and ulnar pulse 2+; decreased range of motion.  Mild tenderness over right elbow.  Tenderness along the humerus.  Decreased range of motion of right shoulder and elbow.  Refill brisk, gross sensation intact   Neurological: She is alert and oriented to person, place, and time. No cranial nerve deficit. GCS eye subscore is 4. GCS verbal subscore is 5. GCS motor subscore is 6.   Skin: Skin is warm and dry. She is not diaphoretic.   Abrasion over right posterior elbow.   Psychiatric: She has a normal mood and affect. Her speech is normal.   Nursing note and vitals reviewed.        MDM and ED Course     X-ray studies pending.  Patient signed out to attending.    ED Medication Orders  None             MDM  Number of Diagnoses or Management Options  Diagnosis management comments: Radiology -     interpreted by me with the following observations:               Amount and/or Complexity of Data Reviewed  Tests in the radiology section of CPT: ordered and reviewed  Discuss the patient with other providers: yes    Risk of Complications, Morbidity, and/or Mortality  Presenting problems: moderate  Diagnostic procedures: low  Management options: low          ED Course as of Mar 24 135   Sat Mar 22, 2017   0252 Spoke with orthopedic surgery, requesting pain medication for bedside reduction  [SI]      ED Course User Index  [SI] Mel Almond             Procedures    Clinical Impression & Disposition     Clinical Impression  Final diagnoses:   None        ED Disposition     None           New Prescriptions    No medications on file                 Tommi Rumps, [place]  03/23/17 1610

## 2017-03-22 NOTE — Discharge Instructions (Signed)
Dear Mrs. Jech:    Thank you for choosing the [facility] Emergency Department, the premier emergency department in the [place] area.  I hope your visit today was EXCELLENT.    Specific instructions for your visit today:    Wrist Fracture    You have a fracture of your wrist.    A fracture is a break in a bone. It means the same thing as saying a "broken bone." In general, fractures heal over about 6-8 weeks. Over time, the broken area gets stronger than the area around it. At first, fractures are often treated with a splint. The splint keeps the injured area immobilized (still). However, the orthopedic (bone) doctor will probably exchange it for a cast. Most fractures can be managed with a splint or cast. Some need surgery for the best alignment and fracture correction. The orthopedic doctor will help decide this.    General fracture care includes pain medicine and the use of a splint/cast to reduce movement. It also includes Resting, Icing, Compressing and Elevating the injured area. Remember this as "RICE."   Rest: Limit the use of the injured body part.   ICE: By applying ice to the affected area, swelling and pain can be reduced. Place some ice cubes in a re-sealable (Ziploc) bag and add some water. Put a thin washcloth between the bag and the skin. Apply the ice bag to the area for at least 20 minutes. Do this at least 4 times per day. Using the ice for longer times and more frequently is OK. NEVER APPLY ICE DIRECTLY TO THE SKIN.   COMPRESS: Compression means to apply pressure around the injured area such as with a splint, cast or an ACE bandage. Compression decreases swelling and improves comfort. Compression should be tight enough to relieve swelling but not so tight as to decrease circulation. Increasing pain, numbness, tingling, or change in skin color, are all signs of decreased circulation.   Elevate: Elevate the injured part. For example, a fractured arm can be elevated by placing the arm  in a sling while awake and propped up on pillows while lying down.    You have been given a SPLINT for your fracture. This is to lower pain and help keep the injured area from moving. You need to wear the splint until you see your doctor or the referral doctor (orthopedic, bone specialist).    Use the SPLINT CARE INSTRUCTIONS below. Do the following many times throughout the day:   Check capillary refill (circulation) in the nail beds. Press on the nail bed and then release. It should turn white when you press on it. It should then get pink again in less than 2 seconds after you let go.   Watch to see if the area beyond the splint gets swollen.   Sometimes the splint is too tight. When this happens, the skin of the hand/fingers is very cold, pale or numb to the touch. The wrap holding the splint in place can be loosened. You can come back here or go to the nearest Emergency Department to have it adjusted.    YOU SHOULD SEEK MEDICAL ATTENTION IMMEDIATELY, EITHER HERE OR AT THE NEAREST EMERGENCY DEPARTMENT, IF ANY OF THE FOLLOWING OCCURS:   Any worsening of the injury.   Severe increase in pain or swelling in the injured area.   New numbness or tingling in or below the injured area.   Your hand gets cold and pale. This could mean the hand has  a problem with its blood supply.                 If you do not continue to improve or your condition worsens, please contact your doctor or return immediately to the Emergency Department.    Sincerely,  Dot Been, MD  Attending Emergency Physician  [facility] Emergency Department    ONSITE PHARMACY  Our full service onsite pharmacy is located in the ER waiting room.  Open 7 days a week from 9 am to 11 pm.  We accept all major insurances and prices are competitive with major retailers.  Ask your provider to print your prescriptions down to the pharmacy to speed you on your way home.    OBTAINING A PRIMARY CARE APPOINTMENT    Primary care physicians  (PCPs, also known as primary care doctors) are either internists or family medicine doctors. Both types of PCPs focus on health promotion, disease prevention, patient education and counseling, and treatment of acute and chronic medical conditions.    Call for an appointment with a primary care doctor.  Ask to see who is taking new patients.     [facility]  telephone:  [phone]  [email or web address]    DOCTOR REFERRALS  Call [phone] (available 24 hours a day, 7 days a week) if you need any further referrals and we can help you find a primary care doctor or specialist.  Also, available online at:  [email or web address]    YOUR CONTACT INFORMATION  Before leaving please check with registration to make sure we have an up-to-date contact number.  You can call registration at [phone] to update your information.  For questions about your hospital bill, please call [phone].  For questions about your Emergency Dept Physician bill please call [phone].      FREE HEALTH SERVICES  If you need help with health or social services, please call 2-1-1 for a free referral to resources in your area.  2-1-1 is a free service connecting people with information on health insurance, free clinics, pregnancy, mental health, dental care, food assistance, housing, and substance abuse counseling.  Also, available online at:  http://www.[facility].org    MEDICAL RECORDS AND TESTS  Certain laboratory test results do not come back the same day, for example urine cultures.   We will contact you if other important findings are noted.  Radiology films are often reviewed again to ensure accuracy.  If there is any discrepancy, we will notify you.      Please call [phone] to pick up a complimentary CD of any radiology studies performed.  If you or your doctor would like to request a copy of your medical records, please call [phone].      ORTHOPEDIC INJURY   Please know that significant  injuries can exist even when an initial x-ray is read as normal or negative.  This can occur because some fractures (broken bones) are not initially visible on x-rays.  For this reason, close outpatient follow-up with your primary care doctor or bone specialist (orthopedist) is required.    MEDICATIONS AND FOLLOWUP  Please be aware that some prescription medications can cause drowsiness.  Use caution when driving or operating machinery.    The examination and treatment you have received in our Emergency Department is provided on an emergency basis, and is not intended to be a substitute for your primary care physician.  It is important that your doctor checks you again and that you report any new  or remaining problems at that time.      [place]  The nearest 24 hour pharmacy is:    [facility] [address] [place], [place] [address]  [place]  [facility])  Call to start or finish an application, compare plans, enroll or ask a question.  [place]: [phone]  Web:  Healthcare.gov    Help Enrolling in [place]  574-624-1256 (TOLL-FREE)  [phone] (TTY)  Web:  Http://www.coverva.org    Local Help Enrolling in the [place]  587-476-1202 (MAIN)  Email:  health-help@nvfs .org  Web:  http://lewis-perez.info/  Address:  [address], [address] [place], [place] [address]    SEDATING MEDICATIONS  Sedating medications include strong pain medications (e.g. narcotics), muscle relaxers, benzodiazepines (used for anxiety and as muscle relaxers), Benadryl/diphenhydramine and other antihistamines for allergic reactions/itching, and other medications.  If you are unsure if you have received a sedating medication, please ask your physician or nurse.  If you received a sedating medication: DO NOT drive a car. DO NOT operate machinery. DO NOT perform jobs where you need to be alert.  DO NOT  drink alcoholic beverages while taking this medicine.     If you get dizzy, sit or lie down at the first signs. Be careful going up and down stairs.  Be extra careful to prevent falls.     Never give this medicine to others.     Keep this medicine out of reach of children.     Do not take or save old medicines. Throw them away when outdated.     Keep all medicines in a cool, dry place. DO NOT keep them in your bathroom medicine cabinet or in a cabinet above the stove.    MEDICATION REFILLS  Please be aware that we cannot refill any prescriptions through the ER. If you need further treatment from what is provided at your ER visit, please follow up with your primary care doctor or your pain management specialist.    [place]  Did you know [facility] has two freestanding ERs located just a few miles away?  [place] [facility] [place] [facility] [place] have short wait times, easy free parking directly in front of the building and top patient satisfaction scores - and the same Board Certified Emergency Medicine doctors as [facility].

## 2017-03-27 ENCOUNTER — Telehealth: Payer: BC Managed Care – PPO

## 2017-03-27 DIAGNOSIS — S52571A Other intraarticular fracture of lower end of right radius, initial encounter for closed fracture: Secondary | ICD-10-CM

## 2017-03-27 NOTE — Pre-Procedure Instructions (Signed)
   No testing requested/required   CHG instructions,contact numbers emailed to [email or web address]

## 2017-03-28 ENCOUNTER — Encounter
Admission: RE | Disposition: A | Discharge status: Home / Self Care | Payer: Self-pay | Source: Ambulatory Visit | Attending: Orthopaedic Surgery

## 2017-03-28 ENCOUNTER — Other Ambulatory Visit: Payer: Self-pay | Admitting: Orthopaedic Surgery

## 2017-03-28 ENCOUNTER — Ambulatory Visit: Payer: BC Managed Care – PPO

## 2017-03-28 ENCOUNTER — Ambulatory Visit: Payer: BC Managed Care – PPO | Admitting: Anesthesiology

## 2017-03-28 ENCOUNTER — Ambulatory Visit
Admission: RE | Admit: 2017-03-28 | Discharge: 2017-03-28 | Disposition: A | Discharge status: Home / Self Care | Payer: BC Managed Care – PPO | Source: Ambulatory Visit | Attending: Orthopaedic Surgery | Admitting: Orthopaedic Surgery

## 2017-03-28 ENCOUNTER — Other Ambulatory Visit: Payer: Self-pay

## 2017-03-28 DIAGNOSIS — W109XXA Fall (on) (from) unspecified stairs and steps, initial encounter: Secondary | ICD-10-CM | POA: Insufficient documentation

## 2017-03-28 DIAGNOSIS — Z880 Allergy status to penicillin: Secondary | ICD-10-CM | POA: Insufficient documentation

## 2017-03-28 DIAGNOSIS — M8580 Other specified disorders of bone density and structure, unspecified site: Secondary | ICD-10-CM | POA: Insufficient documentation

## 2017-03-28 DIAGNOSIS — S52571A Other intraarticular fracture of lower end of right radius, initial encounter for closed fracture: Secondary | ICD-10-CM

## 2017-03-28 DIAGNOSIS — M797 Fibromyalgia: Secondary | ICD-10-CM | POA: Insufficient documentation

## 2017-03-28 HISTORY — DX: Fibromyalgia: M79.7

## 2017-03-28 HISTORY — DX: Sarcoidosis, unspecified: D86.9

## 2017-03-28 SURGERY — ORIF, RADIUS, DISTAL (WRIST)
Anesthesia: Anesthesia General | Site: Arm Lower | Laterality: Right | Wound class: Clean

## 2017-03-28 MED ORDER — GLYCOPYRROLATE 0.2 MG/ML IJ SOLN
INTRAMUSCULAR | Status: AC
Start: 2017-03-28 — End: ?
  Filled 2017-03-28: qty 1

## 2017-03-28 MED ORDER — ROPIVACAINE HCL 5 MG/ML IJ SOLN
20.0000 mL | Freq: Once | INTRAMUSCULAR | Status: DC | PRN
Start: 2017-03-28 — End: 2017-03-28

## 2017-03-28 MED ORDER — LIDOCAINE HCL 2 % IJ SOLN
INTRAMUSCULAR | Status: DC | PRN
Start: 2017-03-28 — End: 2017-03-28
  Administered 2017-03-28: 100 mg via INTRAVENOUS

## 2017-03-28 MED ORDER — LACTATED RINGERS IV SOLN
50.0000 mL/h | INTRAVENOUS | Status: DC
Start: 2017-03-28 — End: 2017-03-28

## 2017-03-28 MED ORDER — LACTATED RINGERS IV SOLN
INTRAVENOUS | Status: DC
Start: 2017-03-28 — End: 2017-03-28

## 2017-03-28 MED ORDER — DIPHENHYDRAMINE HCL 50 MG/ML IJ SOLN
6.2500 mg | Freq: Once | INTRAMUSCULAR | Status: DC | PRN
Start: 2017-03-28 — End: 2017-03-28

## 2017-03-28 MED ORDER — DEXTROSE 5 % IV SOLN
2.0000 g | INTRAVENOUS | Status: AC
Start: 2017-03-28 — End: 2017-03-28
  Administered 2017-03-28: 10:00:00 2 g via INTRAVENOUS

## 2017-03-28 MED ORDER — MIDAZOLAM HCL 2 MG/2ML IJ SOLN
INTRAMUSCULAR | Status: DC | PRN
Start: 2017-03-28 — End: 2017-03-28
  Administered 2017-03-28: 2 mg via INTRAVENOUS

## 2017-03-28 MED ORDER — MIDAZOLAM HCL 2 MG/2ML IJ SOLN
INTRAMUSCULAR | Status: AC
Start: 2017-03-28 — End: ?
  Filled 2017-03-28: qty 2

## 2017-03-28 MED ORDER — ROCURONIUM BROMIDE 50 MG/5ML IV SOLN
INTRAVENOUS | Status: AC
Start: 2017-03-28 — End: ?
  Filled 2017-03-28: qty 5

## 2017-03-28 MED ORDER — GLYCOPYRROLATE 0.2 MG/ML IJ SOLN
INTRAMUSCULAR | Status: AC
Start: 2017-03-28 — End: ?
  Filled 2017-03-28: qty 2

## 2017-03-28 MED ORDER — PROPOFOL 10 MG/ML IV EMUL (WRAP)
INTRAVENOUS | Status: DC | PRN
Start: 2017-03-28 — End: 2017-03-28
  Administered 2017-03-28: 150 mg via INTRAVENOUS

## 2017-03-28 MED ORDER — LIDOCAINE HCL 1 % IJ SOLN
5.0000 mL | Freq: Once | INTRAMUSCULAR | Status: DC
Start: 2017-03-28 — End: 2017-03-28

## 2017-03-28 MED ORDER — SODIUM CHLORIDE 0.9 % IR SOLN
Status: DC | PRN
Start: 2017-03-28 — End: 2017-03-28
  Administered 2017-03-28: 1000 mL

## 2017-03-28 MED ORDER — ROCURONIUM BROMIDE 50 MG/5ML IV SOLN
INTRAVENOUS | Status: DC | PRN
Start: 2017-03-28 — End: 2017-03-28
  Administered 2017-03-28: 40 mg via INTRAVENOUS

## 2017-03-28 MED ORDER — NEOSTIGMINE METHYLSULFATE 1 MG/ML IJ/IV SOLN (WRAP)
Status: AC
Start: 2017-03-28 — End: ?
  Filled 2017-03-28: qty 5

## 2017-03-28 MED ORDER — FAMOTIDINE 20 MG/2ML IV SOLN
INTRAVENOUS | Status: AC
Start: 2017-03-28 — End: ?
  Filled 2017-03-28: qty 2

## 2017-03-28 MED ORDER — PROPOFOL 10 MG/ML IV EMUL (WRAP)
INTRAVENOUS | Status: AC
Start: 2017-03-28 — End: ?
  Filled 2017-03-28: qty 20

## 2017-03-28 MED ORDER — FENTANYL CITRATE (PF) 50 MCG/ML IJ SOLN (WRAP)
25.0000 ug | INTRAMUSCULAR | Status: DC | PRN
Start: 2017-03-28 — End: 2017-03-28

## 2017-03-28 MED ORDER — ONDANSETRON HCL 4 MG/2ML IJ SOLN
INTRAMUSCULAR | Status: DC | PRN
Start: 2017-03-28 — End: 2017-03-28
  Administered 2017-03-28: 4 mg via INTRAVENOUS

## 2017-03-28 MED ORDER — DEXAMETHASONE SODIUM PHOSPHATE 20 MG/5ML IJ SOLN
INTRAMUSCULAR | Status: AC
Start: 2017-03-28 — End: ?
  Filled 2017-03-28: qty 5

## 2017-03-28 MED ORDER — ROPIVACAINE HCL 5 MG/ML IJ SOLN
INTRAMUSCULAR | Status: AC
Start: 2017-03-28 — End: ?
  Filled 2017-03-28: qty 30

## 2017-03-28 MED ORDER — SENNOSIDES-DOCUSATE SODIUM 8.6-50 MG PO TABS
2.0000 | ORAL_TABLET | Freq: Two times a day (BID) | ORAL | 0 refills | Status: AC
Start: 2017-03-28 — End: ?

## 2017-03-28 MED ORDER — NEOSTIGMINE METHYLSULFATE 1 MG/ML IJ/IV SOLN (WRAP)
Status: DC | PRN
Start: 2017-03-28 — End: 2017-03-28
  Administered 2017-03-28: 12:00:00 4 mL via INTRAVENOUS

## 2017-03-28 MED ORDER — FENTANYL CITRATE (PF) 50 MCG/ML IJ SOLN (WRAP)
INTRAMUSCULAR | Status: DC | PRN
Start: 2017-03-28 — End: 2017-03-28
  Administered 2017-03-28 (×2): 50 ug via INTRAVENOUS

## 2017-03-28 MED ORDER — ROPIVACAINE HCL 5 MG/ML IJ SOLN
INTRAMUSCULAR | Status: DC | PRN
Start: 2017-03-28 — End: 2017-03-28
  Administered 2017-03-28: 20 mL via PERINEURAL

## 2017-03-28 MED ORDER — FAMOTIDINE 10 MG/ML IV SOLN (WRAP)
INTRAVENOUS | Status: DC | PRN
Start: 2017-03-28 — End: 2017-03-28
  Administered 2017-03-28: 20 mg via INTRAVENOUS

## 2017-03-28 MED ORDER — ONDANSETRON HCL 4 MG/2ML IJ SOLN
INTRAMUSCULAR | Status: AC
Start: 2017-03-28 — End: ?
  Filled 2017-03-28: qty 2

## 2017-03-28 MED ORDER — OXYCODONE HCL 5 MG PO TABS
5.0000 mg | ORAL_TABLET | Freq: Once | ORAL | Status: DC | PRN
Start: 2017-03-28 — End: 2017-03-28

## 2017-03-28 MED ORDER — DEXAMETHASONE SOD PHOSPHATE PF 10 MG/ML IJ SOLN
INTRAMUSCULAR | Status: DC | PRN
Start: 2017-03-28 — End: 2017-03-28
  Administered 2017-03-28: 5 mg

## 2017-03-28 MED ORDER — GABAPENTIN 100 MG PO CAPS
300.0000 mg | ORAL_CAPSULE | Freq: Three times a day (TID) | ORAL | 0 refills | Status: AC
Start: 2017-03-28 — End: 2017-04-11
  Filled 2017-03-28: qty 42, 7d supply, fill #0

## 2017-03-28 MED ORDER — DEXAMETHASONE SODIUM PHOSPHATE 4 MG/ML IJ SOLN (WRAP)
INTRAMUSCULAR | Status: DC | PRN
Start: 2017-03-28 — End: 2017-03-28
  Administered 2017-03-28: 10 mg via INTRAVENOUS

## 2017-03-28 MED ORDER — CEFAZOLIN SODIUM-DEXTROSE 2-3 GM-%(50ML) IV SOLR
INTRAVENOUS | Status: AC
Start: 2017-03-28 — End: ?
  Filled 2017-03-28: qty 50

## 2017-03-28 MED ORDER — SENNOSIDES-DOCUSATE SODIUM 8.6-50 MG PO TABS
2.0000 | ORAL_TABLET | Freq: Two times a day (BID) | ORAL | Status: DC
Start: 2017-03-28 — End: 2017-03-28

## 2017-03-28 MED ORDER — OXYCODONE HCL 5 MG PO TABS
5.0000 mg | ORAL_TABLET | ORAL | Status: DC | PRN
Start: 2017-03-28 — End: 2017-03-28

## 2017-03-28 MED ORDER — PROMETHAZINE HCL 12.5 MG PO TABS
12.5000 mg | ORAL_TABLET | Freq: Four times a day (QID) | ORAL | 0 refills | Status: AC | PRN
Start: 2017-03-28 — End: ?
  Filled 2017-03-28: qty 15, 4d supply, fill #0

## 2017-03-28 MED ORDER — LIDOCAINE HCL (PF) 2 % IJ SOLN
INTRAMUSCULAR | Status: AC
Start: 2017-03-28 — End: ?
  Filled 2017-03-28: qty 5

## 2017-03-28 MED ORDER — HYDROMORPHONE HCL 0.5 MG/0.5 ML IJ SOLN
0.5000 mg | INTRAMUSCULAR | Status: DC | PRN
Start: 2017-03-28 — End: 2017-03-28

## 2017-03-28 MED ORDER — OXYCODONE HCL 5 MG PO TABS
5.0000 mg | ORAL_TABLET | ORAL | 0 refills | Status: AC | PRN
Start: 2017-03-28 — End: 2017-04-11
  Filled 2017-03-28: qty 42, 7d supply, fill #0

## 2017-03-28 MED ORDER — ONDANSETRON HCL 4 MG/2ML IJ SOLN
4.0000 mg | Freq: Once | INTRAMUSCULAR | Status: DC | PRN
Start: 2017-03-28 — End: 2017-03-28

## 2017-03-28 MED ORDER — PROMETHAZINE HCL 25 MG/ML IJ SOLN
6.2500 mg | Freq: Once | INTRAMUSCULAR | Status: DC | PRN
Start: 2017-03-28 — End: 2017-03-28

## 2017-03-28 SURGICAL SUPPLY — 88 items
APPLCATOR CHLORAPREP 26ML (Prep) ×6 IMPLANT
BANDAGE ELASTIC NS 4IN (Procedure Accessories) ×1
BANDAGE KERLIX MEDIUM GAUZE L3.6 YD X (Dressing) ×1 IMPLANT
BANDAGE PLASTER EFS 4IN X 5YD (Cast)
BANDAGE PLASTER SPECIALIST PLASTER OF (Cast)
BANDAGE PLASTER SPECIALIST PLASTER OF PARIS L5 YD X W4 IN SMOOTH (Cast) IMPLANT
BANDAGE PROCARE COMP L5 YD X W4 IN 2 CLIP FASTENER SLF CLSR PLSTR CTTN (Procedure Accessories) ×1 IMPLANT
BANDAGE PROCARE COMPRESSION L5 YD X W4 (Procedure Accessories) ×1
BASIN MAJOR SET (Kits) ×1
BIT DRILL L100 MM OD2 MM QUICK COUPLING (Drillbits) ×1 IMPLANT
BIT DRILL L100 MM OD2 MM SYNTHES QUICK (Drillbits) ×1
BIT DRILL L110 MM OD1.8 MM STAINLESS STEEL QUICK COUPLING DEPTH MARK (Drillbits) ×1 IMPLANT
BIT DRILL L110 MM OD1.8 MM SYNTHES (Drillbits) ×1
BNDG WEBRIL NONSTRL 4IN (Procedure Accessories) ×2
COVER FLEXIBLE LIGHT HANDLE PLASTIC GREEN (Procedure Accessories) ×4 IMPLANT
COVER FLEXIBLE MEDLINE LIGHT HANDLE (Procedure Accessories) ×4
CVR LGHTHNDL FLEX SFT 1PK (Procedure Accessories) ×4
DRAPE 74X41IN UNIVERSAL POLY XRAY C ARM CLOSURE STRAP (Drape) IMPLANT
DRAPE 84X54IN MINI C ARM OEC 6800 DISPOSABLE (Drape) IMPLANT
DRAPE CARM MN 84X54IN DISP OEC 6800 (Drape)
DRAPE EQP VLCR POLY UNV STRDRP 74X41IN (Drape)
DRAPE MINI C-ARM  54X84 (Drape)
DRAPE PLASTIC U 60X72 (Drape) ×1
DRAPE SRG TBRN CNVRT 72X60IN LF STRL U (Drape) ×1
DRAPE SURGICAL FANFOLD L98 IN X W72 IN (Drape) ×2
DRAPE SURGICAL FANFOLD L98 IN X W72 IN CONVERTORS TIBURON LARGE (Drape) ×2 IMPLANT
DRAPE SURGICAL U STRIP IMPERVIOUS ADHESIVE SPLIT L72 IN X W60 IN (Drape) ×1 IMPLANT
DRAPE XRAY C-ARM 27X17X70 (Drape)
DRESSING PETRO 3% BI 3BRM GZE XR 9X5IN (Dressing) ×1
DRESSING PETROLATUM XEROFORM L8 IN X W1 (Dressing) ×1
DRESSING PETROLATUM XEROFORM L8 IN X W1 IN 3% BISMUTH TRIBROMOPHENATE (Dressing) ×1 IMPLANT
DRESSING PETROLATUM XEROFORM L9 IN X W5 IN 3% BISMUTH TRIBROMOPHENATE (Dressing) ×1 IMPLANT
DRESSING XEROFORM 1X8 (Dressing) ×1
DRESSING XEROFORM 5X9IN (Dressing) ×1
DRILL BIT 1.8 W/DPTH MRK QC110 (Drillbits) ×1
DRILL BIT QC 100/75MM 2MM (Drillbits) ×1
ELECTRODE ADULT PATIENT RETURN L9 FT REM POLYHESIVE ACRYLIC FOAM (Procedure Accessories) ×1 IMPLANT
ELECTRODE PATIENT RETURN L9 FT VALLEYLAB (Procedure Accessories) ×1
GAUZE KRLX STRL 3.4INX3.6YRD (Dressing) ×1
GLOVE BIOGEL SURGCL INDICTR 8 (Glove) ×1
GLOVE SURG LTX SZ 8 STRL (Glove) ×4 IMPLANT
GLOVE SURGICAL 8 INDICATOR BIOGEL POWDER (Glove) ×1
GLOVE SURGICAL 8 INDICATOR BIOGEL POWDER FREE SMOOTH BEAD CUFF (Glove) ×1 IMPLANT
PACK HAND UPPER EXTREMITY (Pack) ×2 IMPLANT
PAD ELECTROSRG GRND REM W CRD (Procedure Accessories) ×1
PADDING CAST L4 YD X W4 IN COHESION HAND TEARABLE SELF BOND SPECIALIST (Procedure Accessories) ×2 IMPLANT
PADDING CAST L4 YD X W4 IN UNDERCAST (Cast) ×1
PADDING CAST L4 YD X W4 IN UNDERCAST MILD STRETCH COHESIVE REGULAR (Cast) ×1 IMPLANT
PADDING CST CTTN SPCLST 100 4YDX4IN LF (Procedure Accessories) ×2
PADNG CAST 4IN STRL (Cast) ×1
PLATE 2.4MM RAD 6H/HD/3H R (Plate) ×1 IMPLANT
PLATE L51 MM NARROW RADIUS RIGHT DISTAL VOLAR 6 HOLE HEAD 3 HOLE SHAFT (Plate) ×1 IMPLANT
PLATE L51 MM NARROW RADIUS RT DISTAL (Plate) ×1 IMPLANT
SCREW BONE L10 MM OD2.7 MM T8 STAINLESS STEEL CORTEX SELF TAP (Screw) ×2 IMPLANT
SCREW L10 MM OD2.7 MM T8 STAINLESS STEEL (Screw) ×2 IMPLANT
SCREW L12 MM OD2.7 MM ODSEC5 MM T8 (Screw) ×2 IMPLANT
SCREW L12 MM OD2.7 MM ODSEC5 MM T8 STAINLESS STEEL CORTEX SELF TAP (Screw) ×2 IMPLANT
SCREW L14 MM OD2.7 MM ODSEC5 MM T8 (Screw) ×1 IMPLANT
SCREW L14 MM OD2.7 MM ODSEC5 MM T8 STAINLESS STEEL CORTEX SELF TAP (Screw) ×1 IMPLANT
SCREW L20 MM OD2.4 MM T8 STAINLESS STEEL (Screw) ×3 IMPLANT
SCREW L20 MM OD2.4 MM T8 STAINLESS STEEL SELF TAP LOCK VARIABLE ANGLE (Screw) ×3 IMPLANT
SCREW L22 MM OD2.4 MM T8 STAINLESS STEEL (Screw) ×1 IMPLANT
SCREW L22 MM OD2.4 MM T8 STAINLESS STEEL SELF TAP LOCK VARIABLE ANGLE (Screw) ×1 IMPLANT
SCREW LCK T8 STD 2.7X10MM (Screw) ×2 IMPLANT
SCREW LCK T8 STD 2.7X12MM (Screw) ×2 IMPLANT
SCREW LCK T8 STD 2.7X14MM (Screw) ×1 IMPLANT
SCREW LCKNG VARI ANG 2.4X22MM (Screw) ×1 IMPLANT
SCREW VAR ANG STR DRV 2.4X20MM (Screw) ×3 IMPLANT
SET SURGICAL BASIN MAJOR (Kits) ×1
SET SURGICAL BASIN MAJOR MEDLINE INDUSTRIES, INC. (Kits) ×1 IMPLANT
SHEET LARGE DRAPE 72X86IN (Drape) ×2
SLN ALC ISOPROPYL 70 4OZ (Scrub Supplies) ×1
SOLUTION ANTISEPTIC RUB BOTTLE MEDLINE (Scrub Supplies) ×1 IMPLANT
SPONGE GAUZE L4 IN X W4 IN 12 PLY (Sponge) ×2 IMPLANT
SPONGE GAUZE STR 10'S 12PLY4X4 (Sponge) ×2
SPONGE LAP XRAY 18X18IN (Sponge) ×1
SPONGE LAPAROTOMY L18 IN X W18 IN (Sponge) ×1
SPONGE LAPAROTOMY L18 IN X W18 IN PREWASH WHITE (Sponge) ×1 IMPLANT
STAPLER APPOSE ULC 35 W SKIN (Staplers)
STAPLER SKIN L4.1 MM X W6.5 MM 35 WIDE (Staplers)
STAPLER SKIN L4.1 MM X W6.5 MM 35 WIDE STAPLE CARTRIDGE APPOSE ULC (Staplers) IMPLANT
SUTURE MONOCRYL 0 CT-1 36IN (Suture) ×1
SUTURE MONOCRYL 0 CT-1 L36 IN (Suture) ×1
SUTURE MONOCRYL 0 CT-1 L36 IN MONOFILAMENT VIOLET ABSORBABLE (Suture) ×1 IMPLANT
SUTURE MONOCRYL 2-0 CT1 36IN (Suture) ×2 IMPLANT
TOURNIQUET 18IN STRL (Procedure Accessories) ×2 IMPLANT
TOWEL STERILE REUSABLE 8PK (Procedure Accessories) ×2 IMPLANT
WIRE K 1.25X150MM (Guide Wire) ×6 IMPLANT

## 2017-03-28 NOTE — Discharge Instructions (Signed)
GOING HOME WITH A NERVE BLOCK    Your anesthesiologist has placed a nerve block to reduce pain following your surgery. Local anesthetic ("numbing medication") has been injected to numb the nerves that supply the site of your procedure. A healthcare provider will call you  To check on your comfort and to answer any questions you have about your nerve block.     WHAT TO EXPECT   It is normal for you arm/leg to feel numb or weak, this will gradually disappear over 12-24 hours.     TAKE YOUR PAIN MEDICATIONS   Even if you have no pain, remember to take your prescribed pain medications before the nerve block wears off. Take the first dose 10 hours after your surgery to prevent a sudden onset of discomfort.     IF YOU HAD SURGERY ON YOUR SHOULDER, ARM OR HAND-   Wear your sling or brace to support entire arm and wrist.   You may experience the following sensations on the same side of the body that the block was placed, this will slowly disappear as the block wears off.   Droopy eye or stuffy nose   Feeling that you cannot take a deep breath   Hoarseness or a weak voice    IF YOU HAD SURGERY ON YOUR LEG OR FOOT-   Wear your leg brace as directed by your surgeon, secure the brace before you get up.  *     Ask for help when getting up and moving around to avoid falling.  *     Use Crutches or a walker when ambulating at home (if prescribed)     REMEMBER - SAFETY FIRST!!   Do not drive or operate potentially dangerous machinery for 24 hours after surgery.   Protect your arm/leg from pressure, heat, and cold.   If you notice any of these symptoms, immediately call your anesthesiologist (see number below).  -Inability to move limb that was operated on the following day   -Pain with inability to move the limb   -Fingers or toes that have changed from their normal color    IF YOU HAVE ANY QUESTIONS, PLEASE CALL THE DEPARTMENT OF ANESTHESIA WHERE YOU HAD YOUR SURGERY OR PROCEDURE:    [facility] Department of  Anesthesia at [phone]. Please ask to speak to an anesthesiologist and include your surgery date that you received a nerve block and the issue you are having related to the nerve block. If an anesthesiologist is unable to speak to you when you call, one will call you back within 24 hours. Anesthesia Discharge Instructions: After Your Surgery  You've just had surgery. During surgery you were given medicine called anesthesia to keep you relaxed and comfortable. After surgery you may have some pain or nausea. This is common. Your doctor or nurse will show you how to take care of yourself when you go home. He or she will also answer your questions. Here are some tips for feeling better and getting well after surgery.        For the first 24 hours after your surgery:  Do not drive or use heavy equipment.   Do not make important decisions or sign legal papers.  Do not drink alcohol.  Have someone stay with you, if needed. He or she can watch for problems and help keep you safe.  Have an adult family member or friend drive you home.    Managing pain  If you have pain after surgery,   pain medicine will help you feel better. Take it as ordered by your surgeon, before pain becomes severe. Also, ask your doctor about other ways to control pain. This might be with rest, ice, repositioning, and elevation. Follow any other instructions your surgeon or nurse gives you.    Tips for taking pain medicine:    Stay on schedule with your medication  Most pain relievers taken by mouth need at least 20 to 30 minutes to start to work.  Taking medicine on a schedule can help you remember to take it. Try to time your medicine so that you can take it before starting an activity. This might be before you get dressed, go for a walk, or sit down for dinner.    Common side effects of prescription pain medications  Pain medicines can cause nausea. Eat a little food before taking pain medicine to avoid this.  Constipation is a common side effect  of pain medicines. Drinking lots of fluids and eating foods, such as fruits and vegetables, that are high in fiber can also help.   Call your doctor before taking any medicines such as laxatives or stool softeners to help ease constipation. Also, ask if you should avoid any foods. Remember, do not take laxatives unless your surgeon has prescribed them  Drinking alcohol and taking pain medicines can cause dizziness and slow your breathing. It can even be deadly. Do not drink alcohol while taking pain medicines.  Pain medicines can make you react more slowly to things. Do not drive or run machinery while taking pain medicines.    Important facts about Acetaminophen (Tylenol)  Acetaminophen or Tylenol is a common pain reliever.  Check your prescription pain medication labels to see if it contains acetaminophen.  Your health care provider may tell you to take acetaminophen to help ease your pain. Ask him or her how much you should to take each day.   Some prescription pain medicines have acetaminophen and other ingredients. Using both prescription pain medicines and over the counter (OTC) acetaminophen for pain can cause you to overdose.  Max dose of acetaminophen is 4000mg/24 hours for most people. Overdosing on acetaminophen may lead to liver failure.  Read the labels on your (over the counter) medicines with care. This will help you to clearly know the list of ingredients, how much to take, and any warnings. This will prevent you from taking too much acetaminophen.  If you have questions or do not understand the information, ask your pharmacist or health care provider to explain it to you before you take the OTC medicine.    Managing nausea  Some people have an upset stomach after surgery. This is often because of anesthesia, pain, pain medicines, or the stress of surgery. If you were on a special food plan before surgery, ask your doctor if you should follow it while you get better. The following are tips to help  you manage nausea after anesthesia:  Do not push yourself to eat. Your body will tell you when to eat and how much.  Start off with clear liquids and soup. They are easier to digest.  Next try semi-solid foods such as mashed potatoes, applesauce, and gelatin, as you feel ready.  Slowly move to solid foods. Don't eat fatty, rich, or spicy foods at first.  Do not force yourself to have 3 large meals a day. Instead eat smaller amounts more often.  Take pain medicines with a small amount of food, such as crackers or   toast, to avoid nausea.     Call your surgeon if:  You have worsening pain an hour after taking pain medicine. The pain medicine may not be strong enough.  You feel too sleepy, dizzy, or groggy. The pain medicine may be too strong.  You have side effects like persistent nausea, vomiting, or skin changes such as rash, itching, or hives.   You have bleeding through your dressing or your dressing becomes saturated.  You have signs of infection such as redness, fever, or increased/foul smelling drainage.      If you have obstructive sleep apnea   You were given anesthesia medicine during surgery to keep you comfortable and free of pain. After surgery, you may have more apnea spells because of this medicine and other medicines you were given. The spells may last longer than usual.   At home:   Keep using the continuous positive airway pressure (CPAP) device when you sleep. Unless your health care provider tells you not to, use it when you sleep, day or night. CPAP is a common device used to treat obstructive sleep apnea.   Talk with your provider before taking any pain medicine, muscle relaxants, or sedatives. Your provider will tell you about the possible dangers of taking these medicines.    

## 2017-03-28 NOTE — Anesthesia Preprocedure Evaluation (Signed)
Anesthesia Evaluation    AIRWAY    Mallampati: II    TM distance: >3 FB  Neck ROM: full  Mouth Opening:full   CARDIOVASCULAR    cardiovascular exam normal       DENTAL    no notable dental hx     PULMONARY    pulmonary exam normal and clear to auscultation     OTHER FINDINGS              Relevant Problems   No relevant active problems               Anesthesia Plan    ASA 2     general                     intravenous induction   Detailed anesthesia plan: general endotracheal  Monitors/Adjuncts: BIS      Post op pain management: per surgeon    informed consent obtained    Plan discussed with CRNA.      pertinent labs reviewed             Signed by: Ballard Russell 03/28/17 8:42 AM

## 2017-03-28 NOTE — Brief Op Note (Signed)
BRIEF OP NOTE    Date Time: 11:45 AM, 03/28/2017      Patient Name:   Misty Fields    Date of Operation:   03/28/2017    Providers Performing:   Surgeon(s) and Role:     * Amado Nash, MD - Primary     * Denim Start, Sharyne Richters, MD - Resident - Assisting    Please page the surgical assistant or Ortho on call ([phone]) for order clarifications    Diagnosis:   Intraarticular distal radius fracture - right     Procedure:   Procedure(s) (LRB):  ORIF, RADIUS, DISTAL (Right)      Anesthesia:    General   Ballard Russell, MD   Anesthesiologist: Ballard Russell, MD     Prep:     Prior to Surgery  Pre-Prep Scrub: Alcohol soaked gauze  Skin Prep Used: Chloraprep      Estimated Blood Loss:    * No blood loss amount entered *      Implants:     Implant Name Type Inv. Item Serial No. Manufacturer Lot No. LRB No. Used Action   PLATE 9.8JX RAD 6H/HD/3H R - [id number] Plate PLATE 1.3YQ RAD 6H/HD/3H R  [employer]  Right 1 Implanted   SCREW VAR ANG STR DRV 2.4X20MM - [id number] Screw SCREW VAR ANG STR DRV 2.4X20MM  [employer]  Right 3 Implanted   SCREW CORTEX STAP SS 1.5X22MM - [id number] Screw SCREW CORTEX STAP SS 1.5X22MM  [employer]  Right 1 Implanted   SCREW LCKNG VARI ANG 2.4X22MM - [id number] Screw SCREW LCKNG VARI ANG 2.4X22MM  [employer] ORTHO  Right 1 Implanted   SCREW LCK T8 STD 2.7X12MM - [id number] Screw SCREW LCK T8 STD 2.7X12MM  [employer]  Right 2 Implanted   SCREW LCK T8 STD 2.7X14MM - [id number] Screw SCREW LCK T8 STD 2.7X14MM   [employer]   Right 1 Implanted         Drains:   none    Complications:   None      Closed Fracture Identification:     Number of Closed Fracture(s): 1    Closed Fracture Classification:     Please Complete the Following in Order of the Closed Fractures with the Highest to Lowest Risk for SSI    [Closed Fracture #1]    Severe Soft Tissue Injury: No  If, Yes: none  AO OTA Classification: B1

## 2017-03-28 NOTE — Progress Notes (Signed)
Right supraclavicular single shot nerve block completed. Vital signs monitored and charted. Sterile protocol maintained. Patient tolerated procedure well. Discussed expected outcomes, (pain control,decreased opioid requirements and side effects, limb safety). Patient verbalized understanding.      Anesthesia doctor, Reola Calkins,  at bedside.

## 2017-03-28 NOTE — Progress Notes (Signed)
Postop note given to pt for return to [facility]

## 2017-03-28 NOTE — Discharge Instr - AVS First Page (Addendum)
Reason for your Hospital Admission:  Distal radius fracture     Instructions for after your discharge:             ORTHOPEDIC TRAUMA SERVICE DISCHARGE INSTRUCTIONS    [facility]             [address]            [place], [place] [address]          [phone] Phone, [phone] Fax      Please contact the office as soon as possible to schedule a follow-up appointment with:  Dr. Emiliano Dyer, MD    You will need to be seen approximately 2 weeks from the date of your surgery  Day of Surgery  S/p Procedure(s) (LRB):  ORIF, RADIUS, DISTAL (Right)   Your staples/sutures will be removed at one of your office visits. Do not allow anyone else to remove your staples.    Activity Weight Bearing Status    Non weight bearing right upper extremity .     Medications    If you have a pain catheter or pump - still take your pain medications as prescribed, even if you feel you do not need them.  Once the pain catheter/pump is discontinued or stops working, it wears off extremely quickly and you will experience pain that is difficult to control if you have not been using your pain medications as prescribed.     If you believe you are having a problem with the pain catheter/pump, please call the number included in the pain pump instructions.  If you feel there is something wrong with your cast/splint/injury site, then please call the office line above to be seen, or come to the emergency department.     You should be taking a stool softener (ex. Colace) as long as you are on any narcotic pain medicines.  You should be having a bowel movement every 2-3 days.  If not, then  proceed with over the counter laxatives (Senokot -S/Miralax), enemas, or suppositories to fix the constipation.    You may take tylenol per package directions provided your pain medication does not have any tylenol in it (Percocet, Vicodin, and Norco all have tylenol. Do not take more than 3000 mg in a 24 hr period.    If you  are taking NSAIDs (Ibuprofen, naprosyn, celebrex) - after approximately 2 weeks, only take them on an as needed basis - do not take them around the clock.     Skilled Nursing Facility Patients  If wound develops erythema or drainage, please do not start on antibiotics, please send patient to Orthopaedic office for a wound check.     Wound Care  -  Change your dressings as needed with sterile gauze (do not have to change if clean and dry).  -  If you have soft dressings only (no splints or casts), remove dressing and  you may take a shower 3 days after surgery (no baths). Do not scrub incision. Pat dry with a clean towel.  -  Do not remove splints/casts. This will be addressed at our follow-up appointment. If you feel that your cast/splint may be too tight or there maybe another issue with it, please call the office to be seen or come to the emergency room.   -  Keep your splint/cast clean and dry.  -  If you have increased drainage, incision redness, foul smell, or fevers - inform the  office.  You may need to be evaluated in the office earlier than your follow-up      appointment.    Diet  -  You may resume your normal diet.  -  It is quite common to have a decreased appetite for the first week or two following       your injury/surgery.  This may be due to your pain level and the pain medications you      are taking.  Be sure to drink plenty of fluids during this time to remain hydrated.   -  You should be taking a stool softener (ex. Colace) as long as you are on any narcotic      pain medicines.  You should be having a bowel movement every 2-3 days.  If not, then       proceed with over the counter laxatives, enemas, or suppositories to fix the      constipation     X-Rays (Radiology Studies)  -  In most cases, we will perform post-op x-rays at our office at the time of your follow-up      appointment.  Check with your health insurance carrier if this is permitted.  If not, they      can provide a list of  providers which are covered.   -  If your x-rays (or MRI or CT) are to be performed anywhere other than our office, you         must make sure they give you the actual x-rays or a CD-ROM to bring with you to the         office visit.  A radiology report is not adequate; the surgeon must visualize the actual      images to determine your plan of care.     [facility] Patients  All outpatient radiology studies (X-ray, CT, MRI, etc) and physical therapy will need to be obtained at a [facility].  You may take the prescriptions which you received to your primary physician, so that they can enter them as a referral in the [facility] system.  You will need to bring the actual films or a CD-ROM with you to the follow-up appointment.  Radiology reports are not sufficient    [facility] Patients  If you have an upcoming planned surgery, you must go to your primary care physician and get a referral for the surgery.  Start this process ASAP, as it takes up to a week to accomplish.       Physical/Occupational Therapy  During the rehabilitation course from your injury, we may prescribe physical or occupational therapy.  In these cases, we will provide you with a list of therapists in the area.  This list in not comprehensive, and you are welcome to go to any therapist that you like - however, you must see if they accept your insurance, or you will be expected to pay at the time services are rendered.     The [facility] are all part of the same healthcare system as [facility] and our office.  These centers accept most health insurance plans, or will participate in the financial arrangement (charity/discounted payment plan) that was put into effect when you presented to [facility].    Blood Clot Prevention    IF YOU ARE A BLOOD THINNER STUDY PATIENT, THIS INFORMATION DOES NOT PERTAIN TO YOU.  STUDY PATIENTS SHOULD TAKE THEIR BLOOD THINNERS PER THE STUDY PROTOCOL.  IF YOU DO NOT KNOW  IF YOU ARE A STUDY PATIENT, PLEASE CALL LOLITA RAMSEY - [phone] TO FIND OUT    You may be given a prescription for a blood thinner (Lovenox) that is meant to minimize your chances for developing blood clots.  These clots are call Deep Venous Thrombosis (DVT) or Pulmonary Embolus (PE).  While preventative measures are not 100% effective, they greatly decrease the chance of developing a DVT or PE.  Usually, when prescribed, we recommend using this for a total of 3 weeks post operatively for fractures below the hip, and 6 weeks for hip/acetabulum/pelvis fractures. Sometimes the course of this medicine will be longer, depending on your injuries.  The nurse in the hospital will teach you the proper technique to do these injections yourself at home.     If you choose not to have this prescription filled, you are at risk of having a VTE (blood clot in leg, arm, or chest). If you do not get this prescription filled, then we strongly suggest that you take aspirin (81 mg) twice per day.   Marland Kitchen      Special Instructions: Call asap to arrange your f/u appt.     [email or web address]

## 2017-03-28 NOTE — Transfer of Care (Signed)
Anesthesia Transfer of Care Note    Patient: Misty Fields    Procedures performed: Procedure(s) with comments:  ORIF, RADIUS, DISTAL - ORIF RIGHT DISTAL RADIUS    Anesthesia type: General ETT    Patient location:Phase I PACU    Last vitals:   Vitals:    03/28/17 1147   BP:    Pulse:    Resp:    Temp: 36.4 C (97.5 F)   SpO2:        Post pain: Patient not complaining of pain, continue current therapy      Mental Status:sedated    Respiratory Function: tolerating face mask    Cardiovascular: stable    Nausea/Vomiting: patient not complaining of nausea or vomiting    Hydration Status: adequate    Post assessment: no apparent anesthetic complications    Signed by: Ballard Russell  03/28/17 11:49 AM

## 2017-03-28 NOTE — Anesthesia Postprocedure Evaluation (Signed)
Anesthesia Post Evaluation    Patient: Misty Fields    Procedure(s) with comments:  ORIF, RADIUS, DISTAL - ORIF RIGHT DISTAL RADIUS    Anesthesia type: General ETT    Last Vitals:   Vitals:    03/28/17 1250   BP: 129/57   Pulse: 81   Resp:    Temp:    SpO2: 98%       Patient Location: Phase I PACU      Post Pain: Patient not complaining of pain, continue current therapy and Pain controlled by preop block    Mental Status: awake    Respiratory Function: tolerating face mask    Cardiovascular: stable    Nausea/Vomiting: patient not complaining of nausea or vomiting    Hydration Status: adequate    Post Assessment: no apparent anesthetic complications, no reportable events and no evidence of recall          Anesthesia Qualified Clinical Data Registry 2018    PACU Reintubation  Did the Patient have general anesthesia with intubation: Yes  Did the Patient require reintubation in the PACU?: No  Was this a planned exubation trial (documented in the medical record)?: No    PONV Adult  Is the patient aged 57 or older: Yes  Did the patient receive recieve a general anesthestic: Yes  Does the patient have 3 or more risk factors for PONV? No        PONV Pediatric  Is the patient aged 77-17? No            PACU Transfer Checklist Protocol  Was the patient transferred to the PACU at the conclusion of surgery? Yes  Was a checklist or transfer protocol used? Yes    ICU Transfer Checklist Protocol  Was the patient transferred to the ICU at the conclusion of surgery? No      Post-op Pain Assessment Prior to Anesthesia Care End  Age >=18 and assessed for pain in PACU: Yes  Pacu pain score <7/10: Yes      Perioperative Mortality  Perioperative mortality prior to Anesthesia end time: No    Perioperative Cardiac Arrest  Did the patient have an unanticipated intraoperative cardiac arrest between anesthesia start time and anesthesia end time? No    Unplanned Admission to ICU  Did the patient have an unplanned admission to the ICU (not  initially anticipated at anesthesia start time)? No      Signed by: Ballard Russell, 03/28/2017 1:00 PM

## 2017-03-28 NOTE — H&P (Signed)
Orthopaedic Consult    Date Time: 03/22/17 5:49 AM  Patient Name: Misty Fabian, MD Attending Physician    Time first seen by orthopaedics: 03/22/2017        Assessment & Plan  Orthopaedic assessment:  56 yo female with right displaced intra-articular distal radius fracture    Reductions/Procedures/Splinting performed (indicate type of Anesthesia used):  Closed reduction performed after a hematoma block applied. Patient placed in well padded splint    Plan:    -Post reduction xray and CT wrist  -elevate RUE  -NWB RUE  -Follow up in clinic in 1 week for surgical scheduling    John Giovanni  Orthopaedic Surgery  Call [phone] with questions/concerns       Pt presents today for surgery as scheduled.     Please refer to recent office/hospital notes for full detail    Past Medical/Surgical History, Meds/Allergies- reviewed    Physical Exam Update:  Pt is awake and alert  Cardiac- RRR  Pulmonary-  +BS    Musculoskeletal exam noted previously    A/P:  Proceed with surgery as scheduled    Emiliano Dyer, MD  Attending Orthopaedic Traumatologist  [facility]  [phone]  Pager [phone]       HPI    Misty Fields is a 56 y.o. year old female.  Orthopaedic consultation has specifically been requested to address this patient's current musculoskeletal presentation. Patient sustained an injury to her right wrist after falling with her husband down a flight of stairs. Noted significant pain and deformity about the wrist, denies any further injuries, denies numbness or tingling in fingers.    Past Medical and Surgical History      PastMedicalHistory         Past Medical History:   Diagnosis Date   . H/O fibromyalgia            PastSurgicalHistory   History reviewed. No pertinent surgical history.       Past Social History & Family History   Social History:   SocialHistory-MainTopic   Social History           Social History   . Marital status:  Married     Spouse name: N/A   . Number of children: N/A   . Years of education: N/A            Social History Main Topics   . Smoking status: Never Smoker   . Smokeless tobacco: Never Used   . Alcohol use Yes      Comment: socially   . Drug use: No   . Sexual activity: Not on file          Other Topics Concern   . Not on file         Social History Narrative   . No narrative on file          Family History:   FamilyHistory   No family history on file.       Relevant Family & Social  history reviewed.  Any findings relevant to current orthopaedic presentation noted in HPI, otherwise the remainder are noncontributory.      Review of Systems:   MSK noted as above.   GI: No current Nausea/vomiting  ENT: Denies sore throat, epistaxis  CV: Denies chest pain   Resp: No current shortness of breath    Other than mentioned above, there are no Constitutional, Neurological, Psychiatric, ENT,  Ophthalmological, Cardiovascular, Respiratory, GI, GU, Musculoskeletal, Integumentary, Lymphatic, Endocrine or Allergic issues.            Home Medications     Prior to Admission medications    Not on File       Allergies          Allergies   Allergen Reactions   . Amoxicillin        Radiology Studies: (actual Orthopaedically relevant films reviewed and read by Orthopaedics)   Displaced intra-articular distal radius fracture                           Physical Exam:     Patient is a 56 y.o. year old female who is alert, well appearing, and in no distress, mood is friendly  Orientation: Fully Oriented    Pulse 71   Temp 97.4 F (36.3 C) (Oral)   Resp 16   Ht 1.676 m (5\' 6" )   Wt 83.9 kg (185 lb)   SpO2 99%   BMI 29.86 kg/m   83.9 kg (185 lb)   1.676 m (5\' 6" )    Gait: not assessed    Heart: regular rate  Lungs: no wheezing      Right Upper Extremity:   Inspection:  swelling and deformity about wrist  Palpation:  Tenderness-severe at fracture site  ROM:  severely limited  Joint Stability:  normal and subluxation at wrist  Strength: limited by pain   Skin: normal along wrist and superficial abrasion over posterior elbow              Peripheral Vascular: normal  Sensation: normal  Coordination: Normal      Right Lower Extremity:     Inspection:  No swelling, erythema, deformity, atrophy or hypertrophy noted  Palpation:  Tenderness-none  ROM:  within normal limits  Joint Stability: normal  Strength: normal  Skin: normal              Peripheral Vascular: normal  Sensation: normal  Coordination: Normal     Left Upper Extremity:   Inspection:  No swelling, erythema, deformity, atrophy or hypertrophy noted  Palpation:  Tenderness-none  ROM:  within normal limits  Joint Stability: normal  Strength: normal  Skin: normal              Peripheral Vascular: normal  Sensation: normal  Coordination: Normal    Left Lower Extremity:   Inspection:  No swelling, erythema, deformity, atrophy or hypertrophy noted  Palpation:  Tenderness-none  ROM:  within normal limits  Joint Stability: normal  Strength: normal  Skin: normal              Peripheral Vascular: normal  Sensation: normal  Coordination: Normal     Pelvis:   Skin: normal  Palpation: Tenderness- none  Stability: normal             The review of the patient's medications does not in any way constitute an endorsement, by this clinician, of their use, dosage, indications, route, efficacy, interactions, or other clinical parameters.    This note was generated within the EPIC EMR using Dragon medical speech recognition software and may contain inherent errors or omissions not intended by the user. Grammatical and punctuation errors, random word insertions, deletions, pronoun errors and incomplete sentences are occasional consequences of this technology due to software limitations. Not all errors are caught or corrected.  Although every attempt is made to root out  erroneus and incomplete transcription, the note may still not fully represent the intent or  opinion of the author. If there are questions or concerns about the content of this note or information contained within the body of this dictation they should be addressed directly with the author for clarification

## 2017-03-29 ENCOUNTER — Encounter: Payer: Self-pay | Admitting: Orthopaedic Surgery

## 2017-03-29 NOTE — Op Note (Signed)
Procedure Date: 03/28/2017     Patient Type: A     SURGEON: Amado Nash MD  ASSISTANT:  Trey Sailors MD     PREOPERATIVE DIAGNOSIS:  Closed intraarticular right distal radius fracture.     POSTOPERATIVE DIAGNOSIS:  Closed intraarticular right distal radius fracture.     TITLE OF PROCEDURE:  Open reduction internal fixation, right intraarticular distal radius,  [id number], greater than 3 fragments.     ANESTHESIA:  General plus regional block.     ESTIMATED BLOOD LOSS:  Minimal.     COMPLICATIONS:  None.     INDICATIONS FOR PROCEDURE:  This is a 56 year old female who sustained the above injury and who is  visiting from out of town regional.  She is going to go back to home in  [place], but her stay here got extended and after discussing the  window of timing for the surgery and the fact that she had approximately 2  weeks give or take to get the surgery taking care of and there was no  urgency unless she elected to proceed with surgery here in [place] prior  to going back home.  We are very explicit in the discussion that there was  no urgency here and she could go home and established care there, but she  decided she wanted to proceed with surgery at this point.  I did discuss  with her the anticipated healing and rehabilitative course as well as  realistic long-term outcomes given the comminution as well as the  osteopenia seen on x-ray.  I have answered all of her questions.  She  consents to proceed.     DESCRIPTION OF PROCEDURE:  The patient was taken to the operating room, placed on the operating table  in supine position.  After anesthesia was administered, a surgical timeout  was performed.  The right arm was prepped and draped in usual sterile  fashion.  The arm was elevated, exsanguinated with an Esmarch bandage,  tourniquet was raised to 250 mmHg.  At that point, a standard FCR approach  was performed to the distal radius.  Sharp dissection was carried down.   The interval between the FCR and  radial artery was identified and split.   Small branches off the radial artery were coagulated with the Bovie.  The  radial artery was taken radially.  The FCR was taken ulnarly.  The volar  musculature was taken ulnarly.  The pronator quadratus was released from  its radial insertion and exposed subperiosteally, showing the volar face of  the radius.  The volar face was extremely comminuted with multiple  intraarticular pieces including a large radial styloid fragment.   Separately, there was a sagittal plane split on the lateral x-ray with an  independent dorsal fragment and CAT scan actually had shown this to be  multiple fragments.  The larger of the volar fragments including the radial  styloid as well as 2 of the other volar fragments were keyed in  anatomically with traction and ulnar deviation and then a retrograde radial  styloid K-wire was used in a retrograde fashion to hold everything  provisionally.  Then, the lunate facet was keyed in over the ulnar  metaphysis and a transverse K-wire was used to hold that as well.  At that  point then an antegrade K-wire from the radial metaphysis further  supplemented the provisional fixation.  X-ray showed reasonable alignment  with a dorsal to volar pressure externally from the skin.  The dorsal  fragments were able to be brought into a reasonable position.  Then, a  Synthes variable angle distal radius locking plate was applied volarly, a  nonlocking screw was placed into the shaft of the plate to pull it down to  the bone.  X-rays confirmed good placement of the plate.  At that point,  traction was held on the arm and the multiple locking screws were placed  into the multiple articular fragments to stabilize them.  Then, x-rays  confirmed that the articular surface was nicely restored.  The provisional  K-wires were removed.  The remainder of the plate was fixed in hybrid mode  with nonlocking screws in the shaft and locking screws in the articular  fragment.   Finally, x-rays confirmed that everything was stable and moved  as a unit.  There was no intraarticular hardware.  The pronator was  repaired to its radial insertion with 2-0 Monocryl sutures.  Tourniquet was  let down at less than 1 hour.  Real-time fluoroscopy confirmed that  everything was stable.  Then, that her radial artery was seen to have a  good visible and palpable pulse.  The wounds were irrigated.  Hemostasis  was achieved.  The subcutaneous tissues were closed with 2-0 Monocryl  suture.  Skin was closed with staples.  Sterile dressings were applied.   The patient was placed in a short-arm volar splint.  She tolerated the  procedure well with no complication.     POSTOPERATIVE DISPOSITION:  The patient will be nonweightbearing to this upper extremity for 6 weeks.   She will start gentle range of motion in the outpatient setting once the  soft tissue envelope is healed in approximately 2 weeks with both gentle  active and passive range of motion, and I have provided written  postoperative protocol to the patient to take back with her to [place] once she finally goes back.           D:  03/28/2017 12:47 PM by Dr. Amado Nash, MD ([id number])  T:  03/29/2017 02:52 AM by NTS      (Conf: [id number]) (Doc ID: [id number])

## 2017-04-14 NOTE — Addendum Note (Signed)
Addendum  created 04/14/17 1409 by Ballard Russell, MD    Anesthesia Intra Blocks edited, Child order released for a procedure order, Sign clinical note

## 2017-04-14 NOTE — Anesthesia Procedure Notes (Signed)
Peripheral    Patient location during procedure: Pre-Op  Reason for block: Post-op pain managment  Injection technique: single-shot  Block Region: Supraclavicular  Laterality: Right  Block at surgeon's request Yes    Staffing  Anesthesiologist: Kaidyn Javid A  Performed: Anesthesiologist     Pre-procedure Checklist   Completed: patient identified, surgical consent, pre-op evaluation, timeout performed, risks and benefits discussed, anesthesia consent given and correct site      Peripheral Block  Patient monitoring: NIBP, EKG, Pulse oximetry and Nasal cannula O2  Patient position: Sitting  Premedication: Yes and Meaningful contact maintained  Local infiltration: Lidocaine 1%    Needle  Needle type: Stim needle   Needle gauge: 20 G  Needle length: 10 cm    Procedures: ultrasound guided  Ultrasound Guided: LA spread visualized, Image stored or printed, Needle visualized and Relevant anatomy identified (nerve, vessels, muscle)      Assessment   Incremental injection: yes  Injection made incrementally with aspirations every 5 mL.  Injection Resistance: no  Paresthesia Pain: no    Blood Aspirated: No  no suspected intravascular injection  Block Outcome: No complications, Successful block and Pain improved

## 2017-12-30 ENCOUNTER — Other Ambulatory Visit: Payer: Self-pay | Admitting: Family Medicine

## 2017-12-30 DIAGNOSIS — Z1231 Encounter for screening mammogram for malignant neoplasm of breast: Secondary | ICD-10-CM

## 2018-02-02 ENCOUNTER — Ambulatory Visit
Admission: RE | Admit: 2018-02-02 | Discharge: 2018-02-02 | Disposition: A | Discharge status: Home / Self Care | Payer: BC Managed Care – PPO | Source: Ambulatory Visit | Attending: Family Medicine | Admitting: Family Medicine

## 2018-02-02 DIAGNOSIS — Z1231 Encounter for screening mammogram for malignant neoplasm of breast: Secondary | ICD-10-CM

## 2019-03-09 ENCOUNTER — Other Ambulatory Visit: Payer: Self-pay | Admitting: Family Medicine

## 2019-03-09 DIAGNOSIS — Z1231 Encounter for screening mammogram for malignant neoplasm of breast: Secondary | ICD-10-CM

## 2019-04-27 ENCOUNTER — Other Ambulatory Visit: Payer: Self-pay

## 2019-04-27 ENCOUNTER — Ambulatory Visit
Admission: RE | Admit: 2019-04-27 | Discharge: 2019-04-27 | Disposition: A | Discharge status: Home / Self Care | Payer: BC Managed Care – PPO | Source: Ambulatory Visit | Attending: Family Medicine | Admitting: Family Medicine

## 2019-04-27 DIAGNOSIS — Z1231 Encounter for screening mammogram for malignant neoplasm of breast: Secondary | ICD-10-CM

## 2019-08-27 ENCOUNTER — Other Ambulatory Visit: Payer: Self-pay | Admitting: Family Medicine

## 2019-08-27 ENCOUNTER — Ambulatory Visit
Admission: RE | Admit: 2019-08-27 | Discharge: 2019-08-27 | Disposition: A | Discharge status: Home / Self Care | Payer: BC Managed Care – PPO | Source: Ambulatory Visit | Attending: Family Medicine | Admitting: Family Medicine

## 2019-08-27 ENCOUNTER — Other Ambulatory Visit: Payer: Self-pay

## 2019-08-27 DIAGNOSIS — M79644 Pain in right finger(s): Secondary | ICD-10-CM

## 2019-08-27 DIAGNOSIS — M25441 Effusion, right hand: Secondary | ICD-10-CM

## 2019-09-13 ENCOUNTER — Other Ambulatory Visit: Payer: Self-pay

## 2019-09-13 ENCOUNTER — Encounter: Payer: Self-pay | Admitting: Rehabilitative and Restorative Service Providers"

## 2019-09-13 ENCOUNTER — Ambulatory Visit (INDEPENDENT_AMBULATORY_CARE_PROVIDER_SITE_OTHER): Payer: BC Managed Care – PPO | Admitting: Rehabilitative and Restorative Service Providers"

## 2019-09-13 DIAGNOSIS — G8929 Other chronic pain: Secondary | ICD-10-CM | POA: Diagnosis not present

## 2019-09-13 DIAGNOSIS — M7541 Impingement syndrome of right shoulder: Secondary | ICD-10-CM | POA: Diagnosis not present

## 2019-09-13 DIAGNOSIS — M6281 Muscle weakness (generalized): Secondary | ICD-10-CM

## 2019-09-13 DIAGNOSIS — R601 Generalized edema: Secondary | ICD-10-CM

## 2019-09-13 DIAGNOSIS — M25511 Pain in right shoulder: Secondary | ICD-10-CM

## 2019-09-13 NOTE — Patient Instructions (Addendum)
1X/week for 4-6 weeks  Access Code: [email or web address]: [email or web address]: 04/26/2021Prepared by: Arlys John NelsonExercises  Standing Scapular Retraction - 5 x daily - 7 x weekly - 1 sets - 5 reps - 5 hold  Supine Bilateral Shoulder Protraction - 2 x daily - 7 x weekly - 1 sets - 20 reps  Shoulder External Rotation with Anchored Resistance - 1 x daily - 7 x weekly - 2 sets - 10 reps

## 2019-09-13 NOTE — Therapy (Addendum)
[facility] Physical Therapy [address] [place], [place], [address] Phone: [phone]   Fax:  [phone]  Physical Therapy Evaluation  Patient Details  Name: Phyllis Kim MRN: [id number] Date of Birth: May 11, 1961 Referring Provider (PT): Eugenia Mcalpine   Encounter Date: 09/13/2019  PT End of Session - 09/13/19 1212    Visit Number  1    Number of Visits  8    Date for PT Re-Evaluation  11/08/19    PT Start Time  1105    PT Stop Time  1200    PT Time Calculation (min)  55 min    Activity Tolerance  Patient tolerated treatment well    Behavior During Therapy  [facility] for tasks assessed/performed       Past Medical History:  Diagnosis Date  . Dysfunctional uterine bleeding   . Dysplasia of cervix, low grade (CIN 1)   . Elective abortion    ONE  . Lymphadenitis   . Menometrorrhagia   . Psoriasis   . Vaginal delivery    3 NSVD'S    Past Surgical History:  Procedure Laterality Date  . CRYOABLATION    . ENDOMETRIAL ABLATION  06/01/10   HER OPTION ENDO ABLATION  . MOUTH SURGERY    . PELVIC LAPAROSCOPY     DIAG LAP X 2 FOR ENDOMETRIOSIS    There were no vitals filed for this visit.   Subjective Assessment - 09/13/19 1114    Subjective  Pain in R shoulder.    Limitations  Other (comment)    How long can you sit comfortably?  As long as needed ([name])    How long can you stand comfortably?  [name]    How long can you walk comfortably?  [name]    Diagnostic tests  MRI    Patient Stated Goals  More comfortable with workouts, reaching and overhead function.    Currently in Pain?  Yes    Pain Score  2     Pain Location  Shoulder    Pain Orientation  Right    Pain Descriptors / Indicators  Stabbing    Pain Type  Chronic pain    Pain Radiating Towards  Isolated to shoulder    Pain Onset  More than a month ago    Pain Frequency  Constant    Aggravating Factors   Turning steering wheel in car.  Arm exercises.  Swimming.    Pain Relieving Factors   Ibuprofen    Effect of Pain on Daily Activities  Driving, reaching, overhead function.         [facility] PT Assessment - 09/13/19 0001      Assessment   Medical Diagnosis  R shoulder impingement    Referring Provider (PT)  Eugenia Mcalpine    Onset Date/Surgical Date  12/19/18    Hand Dominance  Right    Next MD Visit  10/20/2019    Prior Therapy  No      Restrictions   Weight Bearing Restrictions  No      Balance Screen   Has the patient fallen in the past 6 months  No    Has the patient had a decrease in activity level because of a fear of falling?   Yes    Is the patient reluctant to leave their home because of a fear of falling?   No      Prior Function   Level of Independence  Independent      Cognition  Overall Cognitive Status  Within Functional Limits for tasks assessed      ROM / Strength   AROM / PROM / Strength  AROM;Strength      AROM   Overall AROM   --   AROM (L/R in degrees): Flexion 180/170; ER 100/9; IR 70/ : H   AROM Assessment Site  Shoulder    Right/Left Shoulder  Left;Right    Right Shoulder Flexion  170 Degrees    Right Shoulder Internal Rotation  65 Degrees    Right Shoulder External Rotation  95 Degrees    Right Shoulder Horizontal  ADduction  40 Degrees    Left Shoulder Flexion  180 Degrees    Left Shoulder Internal Rotation  70 Degrees    Left Shoulder External Rotation  100 Degrees    Left Shoulder Horizontal ADduction  40 Degrees      Strength   Overall Strength  Deficits    Strength Assessment Site  Shoulder    Right/Left Shoulder  Right;Left    Right Shoulder Internal Rotation  5/5    Right Shoulder External Rotation  3+/5    Left Shoulder Internal Rotation  4+/5    Left Shoulder External Rotation  4-/5                Objective measurements completed on examination: See above findings.      [place] Adult PT Treatment/Exercise - 09/13/19 0001      Therapeutic Activites    Therapeutic Activities  Other Therapeutic  Activities   Discussed activities to avoid (repetitive reaching & OH)     Exercises   Exercises  Shoulder      Shoulder Exercises: Supine   Protraction  Strengthening;20 reps;Weights    Protraction Weight (lbs)  4# (8# next visit)    Theraband Level (Shoulder External Rotation)  Level 2 (Red);Other (comment)   2 sets of 10, slow eccentrics     Shoulder Exercises: Standing   Retraction  Strengthening;20 reps   Shoulder blade pinches 5 seconds            PT Education - 09/13/19 1143    Education Details  Avoid repetitive reaching and overhead function.    Person(s) Educated  Patient    Methods  Explanation;Demonstration;Verbal cues    Comprehension  Verbalized understanding;Returned demonstration       PT Short Term Goals - 09/13/19 1213      PT SHORT TERM GOAL #1   Title  Roselie will demonstrate independence with her HEP.    Time  4    Period  Weeks    Status  New    Target Date  10/11/19        PT Long Term Goals - 09/13/19 1214      PT LONG TERM GOAL #1   Title  Brittnee will return to the gym and be able to turn her steering wheel without limitations.    Time  8    Period  Weeks    Status  New    Target Date  11/08/19      PT LONG TERM GOAL #2   Title  Titianna will report R shoulder pain consistently <2/10 on the Numeric Pain rating Scale.    Time  8    Period  Weeks    Status  New    Target Date  11/08/19      PT LONG TERM GOAL #3   Title  Asees will improve R shoulder strength for  ER to 4/5 MMT or better by DC.    Time  8    Period  Weeks    Status  New      PT LONG TERM GOAL #4   Target Date  11/08/19             Plan - 09/13/19 1524    Clinical Impression Statement  Erine has R shoulder impingement due to shoulder ER strength weakness.  With resoration of a normal ER:IR strength ratio, her prognosis to improve her ability to turn a steering wheel, reach and function overhead is good.    Examination-Activity Limitations   Dressing;Sleep;Reach Overhead    Examination-Participation Restrictions  Cleaning;Laundry;Driving    Stability/Clinical Decision Making  Stable/Uncomplicated    Clinical Decision Making  Low    Rehab Potential  Good    PT Frequency  1x / week    PT Duration  8 weeks    PT Treatment/Interventions  ADLs/Self Care Home Management;Cryotherapy;Therapeutic activities;Functional mobility training;Therapeutic exercise;Neuromuscular re-education;Manual techniques;Passive range of motion    PT Next Visit Plan  ER strengthening    PT Home Exercise Plan  Scapular protraction and retraction strengthening and posterior RTC strengthening.    Consulted and Agree with Plan of Care  Patient       Patient will benefit from skilled therapeutic intervention in order to improve the following deficits and impairments:  Decreased mobility, Decreased activity tolerance, Decreased strength, Pain  Visit Diagnosis: Chronic right shoulder pain  Impingement syndrome of right shoulder  Muscle weakness (generalized)  Generalized edema     Problem List Patient Active Problem List   Diagnosis Date Noted  . Low grade squamous intraepithelial lesion on cytologic smear of cervix (LGSIL) 09/27/2014  . Pulmonary nodules 10/17/2010  . Dyspnea on exertion 10/17/2010    Cherlyn Cushing MPT 09/13/2019, 3:34 PM  [facility] Physical Therapy [address] [place], [place], [address] Phone: [phone]   Fax:  [phone]  Name: MELLISA ARSHAD MRN: [id number] Date of Birth: 02-27-1961

## 2019-09-13 NOTE — Addendum Note (Signed)
Addended by: Wende Crease on: 09/13/2019 03:36 PM   Modules accepted: Orders

## 2019-09-20 ENCOUNTER — Other Ambulatory Visit: Payer: Self-pay

## 2019-09-20 ENCOUNTER — Ambulatory Visit (INDEPENDENT_AMBULATORY_CARE_PROVIDER_SITE_OTHER): Payer: BC Managed Care – PPO | Admitting: Rehabilitative and Restorative Service Providers"

## 2019-09-20 ENCOUNTER — Encounter: Payer: Self-pay | Admitting: Rehabilitative and Restorative Service Providers"

## 2019-09-20 DIAGNOSIS — M6281 Muscle weakness (generalized): Secondary | ICD-10-CM

## 2019-09-20 DIAGNOSIS — G8929 Other chronic pain: Secondary | ICD-10-CM

## 2019-09-20 DIAGNOSIS — M25511 Pain in right shoulder: Secondary | ICD-10-CM | POA: Diagnosis not present

## 2019-09-20 DIAGNOSIS — R601 Generalized edema: Secondary | ICD-10-CM | POA: Diagnosis not present

## 2019-09-20 DIAGNOSIS — M7541 Impingement syndrome of right shoulder: Secondary | ICD-10-CM | POA: Diagnosis not present

## 2019-09-20 NOTE — Therapy (Signed)
[facility] Physical Therapy [address] [place], [place], [address] Phone: [phone]   Fax:  [phone]  Physical Therapy Treatment  Patient Details  Name: Phyllis Kim MRN: [id number] Date of Birth: April 26, 1961 Referring Provider (PT): Sydnee Cabal   Encounter Date: 09/20/2019  PT End of Session - 09/20/19 1501    Visit Number  2    Number of Visits  8    Date for PT Re-Evaluation  11/08/19    PT Start Time  1300    PT Stop Time  1345    PT Time Calculation (min)  45 min    Activity Tolerance  Patient tolerated treatment well    Behavior During Therapy  [facility] for tasks assessed/performed       Past Medical History:  Diagnosis Date  . Dysfunctional uterine bleeding   . Dysplasia of cervix, low grade (CIN 1)   . Elective abortion    ONE  . Lymphadenitis   . Menometrorrhagia   . Psoriasis   . Vaginal delivery    3 NSVD'S    Past Surgical History:  Procedure Laterality Date  . CRYOABLATION    . ENDOMETRIAL ABLATION  06/01/10   HER OPTION ENDO ABLATION  . MOUTH SURGERY    . PELVIC LAPAROSCOPY     DIAG LAP X 2 FOR ENDOMETRIOSIS    There were no vitals filed for this visit.  Subjective Assessment - 09/20/19 1451    Subjective  Phyllis Kim reports good HEP compliance.  Weakness in her ER was most noted with her HEP.    Limitations  Other (comment)    How long can you sit comfortably?  As long as needed ([name])    How long can you stand comfortably?  [name]    How long can you walk comfortably?  [name]    Diagnostic tests  MRI    Patient Stated Goals  More comfortable with workouts, reaching and overhead function.    Currently in Pain?  Yes    Pain Score  2     Pain Onset  More than a month ago                       [facility] Adult PT Treatment/Exercise - 09/20/19 0001      Neuro Re-ed    Neuro Re-ed Details   --   Rhythmic stabilizations 8 min ER strength (Neutral/IR/ER)     Exercises   Exercises  Shoulder      Shoulder  Exercises: Supine   Protraction  Strengthening;20 reps;Weights    Protraction Weight (lbs)  8#    Theraband Level (Shoulder External Rotation)  Level 2 (Red)   2 sets of 10   Other Supine Exercises  --   Passive IR stretching with PT 5X 10 seconds     Shoulder Exercises: Prone   Other Prone Exercises  Prone 90 degrees full ER (thumb up)   2 sets of 5  3 seconds     Shoulder Exercises: Sidelying   External Rotation  10 reps;Strengthening   2 sets with 1#     Shoulder Exercises: Standing   Retraction  Strengthening;20 reps   Shoulder blade pinches 5 seconds   Theraband Level (Shoulder Retraction)  Level 3 (Green)   20X 3 seconds     Shoulder Exercises: ROM/Strengthening   UBE (Upper Arm Bike)  8 minutes Level 3.2 (Push/Pull)             PT Education -  09/20/19 1500    Education Details  Review HEP and added 2 activities to [employer]) Educated  Patient    Methods  Explanation;Demonstration;Verbal cues;Tactile cues    Comprehension  Tactile cues required;Verbalized understanding;Returned demonstration;Need further instruction;Verbal cues required       PT Short Term Goals - 09/13/19 1213      PT SHORT TERM GOAL #1   Title  Phyllis Kim will demonstrate independence with her HEP.    Time  4    Period  Weeks    Status  New    Target Date  10/11/19        PT Long Term Goals - 09/13/19 1214      PT LONG TERM GOAL #1   Title  Phyllis Kim will return to the gym and be able to turn her steering wheel without limitations.    Time  8    Period  Weeks    Status  New    Target Date  11/08/19      PT LONG TERM GOAL #2   Title  Phyllis Kim will report R shoulder pain consistently <2/10 on the Numeric Pain rating Scale.    Time  8    Period  Weeks    Status  New    Target Date  11/08/19      PT LONG TERM GOAL #3   Title  Phyllis Kim will improve R shoulder strength for ER to 4/5 MMT or better by DC.    Time  8    Period  Weeks    Status  New      PT LONG TERM GOAL #4   Target  Date  11/08/19            Plan - 09/20/19 1501    Clinical Impression Statement  ER and scapular strengthening remain the focus to reduce impingement with reaching and driving.  AROM appears good with limitations due to poor mechanics/strength.    Examination-Activity Limitations  Dressing;Sleep;Reach Overhead    Examination-Participation Restrictions  Cleaning;Laundry;Driving    Stability/Clinical Decision Making  Stable/Uncomplicated    Rehab Potential  Good    PT Frequency  1x / week    PT Duration  8 weeks    PT Treatment/Interventions  ADLs/Self Care Home Management;Cryotherapy;Therapeutic activities;Functional mobility training;Therapeutic exercise;Neuromuscular re-education;Manual techniques;Passive range of motion    PT Next Visit Plan  ER and scapular strengthening    PT Home Exercise Plan  Scapular protraction and retraction strengthening and posterior RTC strengthening.    Consulted and Agree with Plan of Care  Patient       Patient will benefit from skilled therapeutic intervention in order to improve the following deficits and impairments:  Decreased mobility, Decreased activity tolerance, Decreased strength, Pain  Visit Diagnosis: Chronic right shoulder pain  Impingement syndrome of right shoulder  Muscle weakness (generalized)  Generalized edema     Problem List Patient Active Problem List   Diagnosis Date Noted  . Low grade squamous intraepithelial lesion on cytologic smear of cervix (LGSIL) 09/27/2014  . Pulmonary nodules 10/17/2010  . Dyspnea on exertion 10/17/2010    Cherlyn Cushing PT, MPT 09/20/2019, 3:04 PM  [facility] Physical Therapy [address] [place], [place], [address] Phone: [phone]   Fax:  [phone]  Name: Phyllis Kim MRN: [id number] Date of Birth: July 23, 1960

## 2019-09-20 NOTE — Patient Instructions (Signed)
Access Code: [email or web address] URL: [email or web address] Date: 09/20/2019 Prepared by: Pauletta Browns  Exercises Sidelying Shoulder External Rotation - 1 x daily - 7 x weekly - 2 sets - 10 reps - 3 hold Prone Shoulder Horizontal Abduction - 1 x daily - 7 x weekly - 2 sets - 10 reps - 3 hold

## 2019-09-29 ENCOUNTER — Ambulatory Visit: Payer: BC Managed Care – PPO | Admitting: Rehabilitative and Restorative Service Providers"

## 2019-09-29 ENCOUNTER — Other Ambulatory Visit: Payer: Self-pay

## 2019-09-29 ENCOUNTER — Encounter: Payer: Self-pay | Admitting: Rehabilitative and Restorative Service Providers"

## 2019-09-29 DIAGNOSIS — M25511 Pain in right shoulder: Secondary | ICD-10-CM | POA: Diagnosis not present

## 2019-09-29 DIAGNOSIS — M6281 Muscle weakness (generalized): Secondary | ICD-10-CM | POA: Diagnosis not present

## 2019-09-29 DIAGNOSIS — M7541 Impingement syndrome of right shoulder: Secondary | ICD-10-CM | POA: Diagnosis not present

## 2019-09-29 DIAGNOSIS — G8929 Other chronic pain: Secondary | ICD-10-CM

## 2019-09-29 NOTE — Therapy (Addendum)
[facility] Physical Therapy [address] [place], [place], [address] Phone: [phone]   Fax:  [phone]  Physical Therapy Treatment  Patient Details  Name: Phyllis Kim MRN: [id number] Date of Birth: [birth year] Referring Provider (Kim): Eugenia Mcalpine   Encounter Date: 09/29/2019  Kim End of Session - 09/29/19 1305    Visit Number  3    Number of Visits  8    Date for Kim Re-Evaluation  11/08/19    Kim Start Time  1304    Kim Stop Time  1357    Kim Time Calculation (min)  53 min    Activity Tolerance  Patient tolerated treatment well;Patient limited by fatigue;No increased pain    Behavior During Therapy  [facility] for tasks assessed/performed       Past Medical History:  Diagnosis Date  . Dysfunctional uterine bleeding   . Dysplasia of cervix, low grade (CIN 1)   . Elective abortion    ONE  . Lymphadenitis   . Menometrorrhagia   . Psoriasis   . Vaginal delivery    3 NSVD'S    Past Surgical History:  Procedure Laterality Date  . CRYOABLATION    . ENDOMETRIAL ABLATION  06/01/10   HER OPTION ENDO ABLATION  . MOUTH SURGERY    . PELVIC LAPAROSCOPY     DIAG LAP X 2 FOR ENDOMETRIOSIS    There were no vitals filed for this visit.  Subjective Assessment - 09/29/19 1304    Subjective  [name] was not as good with her HEP over her birthday week.    Limitations  Other (comment)    How long can you sit comfortably?  As long as needed ([name])    How long can you stand comfortably?  [name]    How long can you walk comfortably?  [name]    Diagnostic tests  MRI    Patient Stated Goals  More comfortable with workouts, reaching and overhead function.    Currently in Pain?  Yes    Pain Score  1     Pain Location  Shoulder    Pain Orientation  Right    Pain Descriptors / Indicators  Aching    Pain Type  Chronic pain    Pain Onset  More than a month ago    Pain Frequency  Rarely    Aggravating Factors   Overuse    Pain Relieving Factors  Ice and ibuprofen    Effect of Pain on Daily Activities  Careful with activities                        [facility] Adult Kim Treatment/Exercise - 09/29/19 0001      Neuro Re-ed    Neuro Re-ed Details   --   Rhythmic stabilizations 8 min ER strength (Neutral/IR/ER)     Exercises   Exercises  Shoulder      Shoulder Exercises: Supine   Protraction  Strengthening;20 reps;Weights    Protraction Weight (lbs)  8#    Theraband Level (Shoulder External Rotation)  Level 2 (Red)   2 sets of 10   Other Supine Exercises  --   Passive IR stretching with Kim 5X 10 seconds     Shoulder Exercises: Prone   Other Prone Exercises  Prone 90 & 100 degrees (thumb up) 2 sets of 5 each      Shoulder Exercises: Sidelying   External Rotation  10 reps;Right;Strengthening   2 sets 1#  Shoulder Exercises: Standing   Retraction  Strengthening;20 reps   Shoulder blade pinches 5 seconds   Theraband Level (Shoulder Retraction)  Level 3 (Green)   20X 3 seconds     Shoulder Exercises: ROM/Strengthening   UBE (Upper Arm Bike)  8 minutes Level 3.2 (Push/Pull)             Kim Education - 09/29/19 1343    Education Details  Reviewed HEP and reminded [name] of the importance of consistent HEP compliance.    Person(s) Educated  Patient    Methods  Explanation;Demonstration;Verbal cues;Tactile cues    Comprehension  Verbalized understanding;Returned demonstration;Tactile cues required;Need further instruction;Verbal cues required       Kim Short Term Goals - 09/13/19 1213      Kim SHORT TERM GOAL #1   Title  [name] will demonstrate independence with her HEP.    Time  4    Period  Weeks    Status  New    Target Date  10/11/19        Kim Long Term Goals - 09/13/19 1214      Kim LONG TERM GOAL #1   Title  [name] will return to the gym and be able to turn her steering wheel without limitations.    Time  8    Period  Weeks    Status  New    Target Date  11/08/19      Kim LONG TERM GOAL #2   Title  [name]  will report R shoulder pain consistently <2/10 on the Numeric Pain rating Scale.    Time  8    Period  Weeks    Status  New    Target Date  11/08/19      Kim LONG TERM GOAL #3   Title  [name] will improve R shoulder strength for ER to 4/5 MMT or better by DC.    Time  8    Period  Weeks    Status  New      Kim LONG TERM GOAL #4   Target Date  11/08/19          10/08/19 1448  Plan  Clinical Impression Statement [name] reports less pain since evaluation.  She did not notice difficulty turning her steering wheel while on vacation.  Although certainly better, [name] still has AROM and strength impairments that will benefit from the recommended course of physical therapy.  Kim Next Visit Plan Continue AROM and strength progressions.  Kim Home Exercise Plan Same.  Consulted and Agree with Plan of Care Patient         Patient will benefit from skilled therapeutic intervention in order to improve the following deficits and impairments:     Visit Diagnosis: Chronic right shoulder pain  Impingement syndrome of right shoulder  Muscle weakness (generalized)     Problem List Patient Active Problem List   Diagnosis Date Noted  . Low grade squamous intraepithelial lesion on cytologic smear of cervix (LGSIL) 09/27/2014  . Pulmonary nodules 10/17/2010  . Dyspnea on exertion 10/17/2010    Phyllis Kim, MPT 09/29/2019, 1:59 PM  [facility] Physical Therapy [address] [place], [place], [address] Phone: [phone]   Fax:  [phone]  Name: Kim Phyllis MRN: [id number] Date of Birth: 1960/09/20

## 2019-10-08 ENCOUNTER — Encounter: Payer: Self-pay | Admitting: Physical Therapy

## 2019-10-08 ENCOUNTER — Ambulatory Visit: Payer: BC Managed Care – PPO | Admitting: Physical Therapy

## 2019-10-08 ENCOUNTER — Other Ambulatory Visit: Payer: Self-pay

## 2019-10-08 DIAGNOSIS — M6281 Muscle weakness (generalized): Secondary | ICD-10-CM | POA: Diagnosis not present

## 2019-10-08 DIAGNOSIS — G8929 Other chronic pain: Secondary | ICD-10-CM | POA: Diagnosis not present

## 2019-10-08 DIAGNOSIS — M25511 Pain in right shoulder: Secondary | ICD-10-CM | POA: Diagnosis not present

## 2019-10-08 DIAGNOSIS — R601 Generalized edema: Secondary | ICD-10-CM | POA: Diagnosis not present

## 2019-10-08 NOTE — Therapy (Signed)
[facility] Physical Therapy [address] [place], [place], [address] Phone: [phone]   Fax:  [phone]  Physical Therapy Treatment  Patient Details  Name: Phyllis Kim MRN: [id number] Date of Birth: [birth year] Referring Provider (PT): Sydnee Cabal   Encounter Date: 10/08/2019  PT End of Session - 10/08/19 0842    Visit Number  4    Number of Visits  8    Date for PT Re-Evaluation  11/08/19    PT Start Time  0802    PT Stop Time  0842    PT Time Calculation (min)  40 min    Activity Tolerance  Patient tolerated treatment well;Patient limited by fatigue;No increased pain    Behavior During Therapy  WFL for tasks assessed/performed       Past Medical History:  Diagnosis Date  . Dysfunctional uterine bleeding   . Dysplasia of cervix, low grade (CIN 1)   . Elective abortion    ONE  . Lymphadenitis   . Menometrorrhagia   . Psoriasis   . Vaginal delivery    3 NSVD'S    Past Surgical History:  Procedure Laterality Date  . CRYOABLATION    . ENDOMETRIAL ABLATION  06/01/10   HER OPTION ENDO ABLATION  . MOUTH SURGERY    . PELVIC LAPAROSCOPY     DIAG LAP X 2 FOR ENDOMETRIOSIS    There were no vitals filed for this visit.  Subjective Assessment - 10/08/19 0804    Subjective  doing well - shoulder is feeling pretty good.  a little sore this morning from working it yesterday.    Limitations  Other (comment)    How long can you sit comfortably?  As long as needed ([name])    How long can you stand comfortably?  [name]    How long can you walk comfortably?  [name]    Diagnostic tests  MRI    Patient Stated Goals  More comfortable with workouts, reaching and overhead function.    Currently in Pain?  Yes    Pain Score  1     Pain Location  Shoulder    Pain Orientation  Right    Pain Descriptors / Indicators  Aching    Pain Type  Chronic pain    Pain Onset  More than a month ago    Pain Frequency  Rarely    Aggravating Factors   overuse, sleeping on  it wrong    Pain Relieving Factors  ice, ibuprofen                        [facility] Adult PT Treatment/Exercise - 10/08/19 0805      Shoulder Exercises: Supine   Protraction  Strengthening;Both;Weights   3x10   Protraction Weight (lbs)  8    Flexion  Right;Weights   3x10   Shoulder Flexion Weight (lbs)  2      Shoulder Exercises: Seated   Other Seated Exercises  bil overhead press with 3# bar 3x10      Shoulder Exercises: Sidelying   External Rotation  Strengthening;Right   3x10   External Rotation Weight (lbs)  1    ABduction  Right;Strengthening;Weights   3x10   ABduction Weight (lbs)  1      Shoulder Exercises: Standing   External Rotation  Right;Theraband   3x10   Theraband Level (Shoulder External Rotation)  Level 3 (Green)    Retraction  Strengthening;Theraband;10 reps   3x10   Theraband  Level (Shoulder Retraction)  Level 3 (Green)      Shoulder Exercises: ROM/Strengthening   UBE (Upper Arm Bike)  L3.5 x 6 min (3' each direction)    Wall Pushups  --   2x10   Wall Pushups Limitations  counter height    Modified Plank Limitations  weight shifting at counter x 10    Ball on Wall  2# ball circles x20 reps each direction, Rt                PT Short Term Goals - 09/13/19 1213      PT SHORT TERM GOAL #1   Title  Charlott will demonstrate independence with her HEP.    Time  4    Period  Weeks    Status  New    Target Date  10/11/19        PT Long Term Goals - 09/13/19 1214      PT LONG TERM GOAL #1   Title  Olene will return to the gym and be able to turn her steering wheel without limitations.    Time  8    Period  Weeks    Status  New    Target Date  11/08/19      PT LONG TERM GOAL #2   Title  Shalandra will report R shoulder pain consistently <2/10 on the Numeric Pain rating Scale.    Time  8    Period  Weeks    Status  New    Target Date  11/08/19      PT LONG TERM GOAL #3   Title  Shaquinta will improve R shoulder strength for ER to  4/5 MMT or better by DC.    Time  8    Period  Weeks    Status  New      PT LONG TERM GOAL #4   Target Date  11/08/19            Plan - 10/08/19 0842    Clinical Impression Statement  Pt tolerated strengthening session well today with expected muscle fatigue.  Will continue to benefit from PT to maximize function.    Examination-Activity Limitations  Dressing;Sleep;Reach Overhead    Examination-Participation Restrictions  Cleaning;Laundry;Driving    Stability/Clinical Decision Making  Stable/Uncomplicated    Rehab Potential  Good    PT Frequency  1x / week    PT Duration  8 weeks    PT Treatment/Interventions  ADLs/Self Care Home Management;Cryotherapy;Therapeutic activities;Functional mobility training;Therapeutic exercise;Neuromuscular re-education;Manual techniques;Passive range of motion    PT Next Visit Plan  ER and scapular strengthening, needs STG assessed    PT Home Exercise Plan  Scapular protraction and retraction strengthening and posterior RTC strengthening.    Consulted and Agree with Plan of Care  Patient       Patient will benefit from skilled therapeutic intervention in order to improve the following deficits and impairments:  Decreased mobility, Decreased activity tolerance, Decreased strength, Pain  Visit Diagnosis: Chronic right shoulder pain  Muscle weakness (generalized)  Generalized edema     Problem List Patient Active Problem List   Diagnosis Date Noted  . Low grade squamous intraepithelial lesion on cytologic smear of cervix (LGSIL) 09/27/2014  . Pulmonary nodules 10/17/2010  . Dyspnea on exertion 10/17/2010      Clarita Crane, PT, DPT 10/08/19 8:45 AM    [facility] Physical Therapy [address] [place], [place], [address] Phone: [phone]   Fax:  [phone]  Name: Phyllis Kim MRN: [id number] Date of Birth: Feb 13, 1961

## 2019-10-19 ENCOUNTER — Ambulatory Visit: Payer: BC Managed Care – PPO | Admitting: Physical Therapy

## 2019-10-19 ENCOUNTER — Other Ambulatory Visit: Payer: Self-pay

## 2019-10-19 ENCOUNTER — Encounter: Payer: Self-pay | Admitting: Physical Therapy

## 2019-10-19 DIAGNOSIS — G8929 Other chronic pain: Secondary | ICD-10-CM

## 2019-10-19 DIAGNOSIS — M7541 Impingement syndrome of right shoulder: Secondary | ICD-10-CM | POA: Diagnosis not present

## 2019-10-19 DIAGNOSIS — R600 Localized edema: Secondary | ICD-10-CM

## 2019-10-19 DIAGNOSIS — M25511 Pain in right shoulder: Secondary | ICD-10-CM

## 2019-10-19 DIAGNOSIS — M6281 Muscle weakness (generalized): Secondary | ICD-10-CM | POA: Diagnosis not present

## 2019-10-19 NOTE — Therapy (Signed)
[facility] Physical Therapy [address] [place], [place], [address] Phone: [phone]   Fax:  [phone]  Physical Therapy Treatment  Patient Details  Name: Phyllis Kim MRN: [id number] Date of Birth: March 26, 1961 Referring Provider (PT): Phyllis Kim   Encounter Date: 10/19/2019  PT End of Session - 10/19/19 1340    Visit Number  5    Number of Visits  8    Date for PT Re-Evaluation  11/08/19    PT Start Time  1300    PT Stop Time  1348   last 8 min on ice   PT Time Calculation (min)  48 min    Activity Tolerance  Patient tolerated treatment well;Patient limited by fatigue;No increased pain    Behavior During Therapy  WFL for tasks assessed/performed       Past Medical History:  Diagnosis Date  . Dysfunctional uterine bleeding   . Dysplasia of cervix, low grade (CIN 1)   . Elective abortion    ONE  . Lymphadenitis   . Menometrorrhagia   . Psoriasis   . Vaginal delivery    3 NSVD'S    Past Surgical History:  Procedure Laterality Date  . CRYOABLATION    . ENDOMETRIAL ABLATION  06/01/10   HER OPTION ENDO ABLATION  . MOUTH SURGERY    . PELVIC LAPAROSCOPY     DIAG LAP X 2 FOR ENDOMETRIOSIS    There were no vitals filed for this visit.  Subjective Assessment - 10/19/19 1308    Subjective  shoulder is doing well, no pain upon arrival    Limitations  Other (comment)    How long can you sit comfortably?  As long as needed (Phyllis Kim)    How long can you stand comfortably?  Phyllis Kim    How long can you walk comfortably?  Phyllis Kim    Diagnostic tests  MRI    Patient Stated Goals  More comfortable with workouts, reaching and overhead function.    Pain Onset  More than a month ago         [facility] PT Assessment - 10/19/19 0001      Assessment   Medical Diagnosis  R shoulder impingement    Referring Provider (PT)  Phyllis Kim    Onset Date/Surgical Date  12/19/18      AROM   Overall AROM Comments  Rt shoulder AROM WFL      Strength   Right  Shoulder Flexion  5/5    Right Shoulder ABduction  4+/5    Right Shoulder Internal Rotation  5/5    Right Shoulder External Rotation  4/5        [facility] Adult PT Treatment/Exercise - 10/19/19 0001      Shoulder Exercises: Supine   Protraction  Strengthening;Both;Weights    Protraction Weight (lbs)  8    Protraction Limitations  3X10      Shoulder Exercises: Sidelying   External Rotation  Right    External Rotation Weight (lbs)  2    External Rotation Limitations  3X10    ABduction  Right    ABduction Weight (lbs)  2    ABduction Limitations  3X10      Shoulder Exercises: ROM/Strengthening   Ball on Wall  2# ball circles x20 reps each direction, Rt     Other ROM/Strengthening Exercises  machines: chest press 15 lbs 2X15, bilat shoulder press 5 lbs 2X10, seated rows 35 lbs 2X15, seated lat pull 25 lbs 2X15, standing tricep ext 25 lbs  2X15, standing rows 20 lb each side 2X15      Modalities   Modalities  Cryotherapy      Cryotherapy   Number Minutes Cryotherapy  8 Minutes    Cryotherapy Location  Shoulder    Type of Cryotherapy  Ice pack       PT Short Term Goals - 10/19/19 1348      PT SHORT TERM GOAL #1   Title  Phyllis Kim will demonstrate independence with her HEP.    Time  4    Period  Weeks    Status  On-going    Target Date  10/11/19        PT Long Term Goals - 10/19/19 1348      PT LONG TERM GOAL #1   Title  Phyllis Kim will return to the gym and be able to turn her steering wheel without limitations.    Time  8    Period  Weeks    Status  On-going      PT LONG TERM GOAL #2   Title  Phyllis Kim will report R shoulder pain consistently <2/10 on the Numeric Pain rating Scale.    Baseline  now 2/10    Time  8    Period  Weeks    Status  On-going      PT LONG TERM GOAL #3   Title  Phyllis Kim will improve R shoulder strength for ER to 4/5 MMT or better by DC.    Time  8    Period  Weeks    Status  New            Plan - 10/19/19 1340    Clinical Impression Statement   Continued with strength program and scapular stabilizaiton. trialed on weight machines today for shoulder/scapular strength with good tolerance and able to progress resistance with other exercises today without complaints of pain just fatigue. She does still lack some Rt shoulder strength and endurance and PT recomends continuing for 3 more sessions.    Examination-Activity Limitations  Dressing;Sleep;Reach Overhead    Examination-Participation Restrictions  Cleaning;Laundry;Driving    Rehab Potential  Good    PT Frequency  1x / week    PT Duration  8 weeks    PT Treatment/Interventions  ADLs/Self Care Home Management;Cryotherapy;Therapeutic activities;Functional mobility training;Therapeutic exercise;Neuromuscular re-education;Manual techniques;Passive range of motion    PT Next Visit Plan  Continue AROM and strength progressions.    PT Home Exercise Plan  Same.    Consulted and Agree with Plan of Care  Patient       Patient will benefit from skilled therapeutic intervention in order to improve the following deficits and impairments:  Decreased mobility, Decreased activity tolerance, Decreased strength, Pain  Visit Diagnosis: Chronic right shoulder pain  Muscle weakness (generalized)  Impingement syndrome of right shoulder  Localized edema     Problem List Patient Active Problem List   Diagnosis Date Noted  . Low grade squamous intraepithelial lesion on cytologic smear of cervix (LGSIL) 09/27/2014  . Pulmonary nodules 10/17/2010  . Dyspnea on exertion 10/17/2010    Debbe Odea, PT,DPT 10/19/2019, 2:08 PM  [facility] Physical Therapy [address] [place], [place], [address] Phone: [phone]   Fax:  [phone]  Name: Phyllis Kim MRN: [id number] Date of Birth: [birth year]

## 2019-10-29 ENCOUNTER — Ambulatory Visit: Payer: BC Managed Care – PPO | Admitting: Rehabilitative and Restorative Service Providers"

## 2019-10-29 ENCOUNTER — Other Ambulatory Visit: Payer: Self-pay

## 2019-10-29 ENCOUNTER — Encounter: Payer: Self-pay | Admitting: Rehabilitative and Restorative Service Providers"

## 2019-10-29 DIAGNOSIS — M25511 Pain in right shoulder: Secondary | ICD-10-CM

## 2019-10-29 DIAGNOSIS — M7541 Impingement syndrome of right shoulder: Secondary | ICD-10-CM

## 2019-10-29 DIAGNOSIS — G8929 Other chronic pain: Secondary | ICD-10-CM

## 2019-10-29 DIAGNOSIS — M6281 Muscle weakness (generalized): Secondary | ICD-10-CM | POA: Diagnosis not present

## 2019-10-29 NOTE — Patient Instructions (Signed)
Access Code: [email or web address] URL: [email or web address] Date: 10/29/2019 Prepared by: Pauletta Browns  Exercises Shoulder External Rotation with Anchored Resistance - 1 x daily - 3 x weekly - 2 sets - 10 reps - 3 hold Sidelying Shoulder External Rotation - 1 x daily - 3 x weekly - 2 sets - 10 reps - 3 hold Prone Shoulder Horizontal Abduction - 1 x daily - 3 x weekly - 2 sets - 5 reps - 3 hold

## 2019-10-29 NOTE — Therapy (Addendum)
[facility] Physical Therapy [address] [place], [place], [address] Phone: [phone]   Fax:  [phone]  Physical Therapy Treatment  Patient Details  Name: Phyllis Kim MRN: [id number] Date of Birth: 18-Jul-1960 Referring Provider (PT): Sydnee Cabal   Encounter Date: 10/29/2019   PT End of Session - 10/29/19 1019    Visit Number 6    Number of Visits 8    Date for PT Re-Evaluation 11/08/19    PT Start Time 0936    PT Stop Time 1016    PT Time Calculation (min) 40 min    Activity Tolerance Patient tolerated treatment well;Patient limited by fatigue;No increased pain    Behavior During Therapy WFL for tasks assessed/performed           Past Medical History:  Diagnosis Date  . Dysfunctional uterine bleeding   . Dysplasia of cervix, low grade (CIN 1)   . Elective abortion    ONE  . Lymphadenitis   . Menometrorrhagia   . Psoriasis   . Vaginal delivery    3 NSVD'S    Past Surgical History:  Procedure Laterality Date  . CRYOABLATION    . ENDOMETRIAL ABLATION  06/01/10   HER OPTION ENDO ABLATION  . MOUTH SURGERY    . PELVIC LAPAROSCOPY     DIAG LAP X 2 FOR ENDOMETRIOSIS    There were no vitals filed for this visit.   Subjective Assessment - 10/29/19 0933    Subjective [name] notes no pain with sleeping, driving or ADLs.  She only notices pain with changes in the weather (arthritis) or sleeping on the R side.    Limitations Other (comment)    How long can you sit comfortably? As long as needed ([name])    How long can you stand comfortably? [name]    How long can you walk comfortably? [name]    Diagnostic tests MRI    Patient Stated Goals More comfortable with workouts, reaching and overhead function.    Pain Score 0-No pain    Pain Onset More than a month ago              [facility] PT Assessment - 10/29/19 0001      ROM / Strength   AROM / PROM / Strength Strength;AROM      AROM   Overall AROM  Within functional limits for tasks  performed    Right Shoulder Flexion 170 Degrees    Right Shoulder Internal Rotation 75 Degrees    Right Shoulder External Rotation 95 Degrees    Right Shoulder Horizontal  ADduction 45 Degrees      Strength   Right Shoulder Internal Rotation 5/5    Right Shoulder External Rotation 4+/5                         [facility] Adult PT Treatment/Exercise - 10/29/19 0001      Exercises   Exercises Shoulder      Shoulder Exercises: Supine   Protraction Strengthening;Both;Weights    Protraction Weight (lbs) 8    Protraction Limitations 20X      Shoulder Exercises: Prone   Other Prone Exercises Prone 90 & 100 degrees (thumb up) 2 sets of 5 each      Shoulder Exercises: Sidelying   External Rotation Right    External Rotation Weight (lbs) 1    External Rotation Limitations 2 sets of 10      Shoulder Exercises: Standing   External Rotation  Strengthening;10 reps   2 sets of 10 with red theraband   Internal Rotation Strengthening;10 reps   Red theraband   Row Strengthening;20 reps   15# each arm     Shoulder Exercises: ROM/Strengthening   UBE (Upper Arm Bike) L3 X 8 minutes Forward and Back alternate every :30                  PT Education - 10/29/19 1018    Education Details Reviewed and update final HEP for discharge.    Person(s) Educated Patient    Methods Explanation;Demonstration;Verbal cues;Handout    Comprehension Verbal cues required;Returned demonstration;Verbalized understanding            PT Short Term Goals - 10/29/19 1016      PT SHORT TERM GOAL #1   Title [name] will demonstrate independence with her HEP.    Time 4    Period Weeks    Status Achieved    Target Date 10/11/19             PT Long Term Goals - 10/29/19 1016      PT LONG TERM GOAL #1   Title [name] will return to the gym and be able to turn her steering wheel without limitations.    Time 8    Period Weeks    Status Achieved      PT LONG TERM GOAL #2   Title [name] will  report R shoulder pain consistently <2/10 on the Numeric Pain rating Scale.    Baseline now 2/10    Time 8    Period Weeks    Status Achieved      PT LONG TERM GOAL #3   Title [name] will improve R shoulder strength for ER to 4/5 MMT or better by DC.    Time 8    Period Weeks    Status Achieved                 Plan - 10/29/19 1019    Clinical Impression Statement [name] has no pain except when sleeping on the R side.  It does not wake her up.  She can drive, Kim and perform overhead ADLs without limitation.  She has met long-term goals.  She appears ready for independent rehabilitation.    Examination-Activity Limitations Dressing;Sleep;Kim Overhead    Examination-Participation Restrictions Cleaning;Laundry;Driving    Rehab Potential Good    PT Frequency 1x / week    PT Duration 8 weeks    PT Treatment/Interventions ADLs/Self Care Home Management;Cryotherapy;Therapeutic activities;Functional mobility training;Therapeutic exercise;Neuromuscular re-education;Manual techniques;Passive range of motion    PT Next Visit Plan DC    PT Home Exercise Plan See patient instructions.    Consulted and Agree with Plan of Care Patient           Patient will benefit from skilled therapeutic intervention in order to improve the following deficits and impairments:  Decreased mobility, Decreased activity tolerance, Decreased strength, Pain  Visit Diagnosis: Chronic right shoulder pain  Muscle weakness (generalized)  Impingement syndrome of right shoulder     Problem List Patient Active Problem List   Diagnosis Date Noted  . Low grade squamous intraepithelial lesion on cytologic smear of cervix (LGSIL) 09/27/2014  . Pulmonary nodules 10/17/2010  . Dyspnea on exertion 10/17/2010    [name] PT, MPT 10/29/2019, 10:24 AM  [facility] Physical Therapy [address] [place], [place], [address] Phone: [phone]   Fax:  [phone]  Name: Phyllis Kim  MRN: [id number] Date of Birth: 23-Jan-1961   PHYSICAL THERAPY DISCHARGE SUMMARY  Visits from Start of Care: 6  Current functional level related to goals / functional outcomes: See above   Remaining deficits: None   Education / Equipment: See above Plan: Patient agrees to discharge.  Patient goals were partially met. Patient is being discharged due to meeting the stated rehab goals.  ?????    Vista Mink PT, MPT

## 2019-11-02 ENCOUNTER — Encounter: Payer: BC Managed Care – PPO | Admitting: Rehabilitative and Restorative Service Providers"

## 2019-11-09 ENCOUNTER — Encounter: Payer: BC Managed Care – PPO | Admitting: Rehabilitative and Restorative Service Providers"

## 2020-04-04 ENCOUNTER — Other Ambulatory Visit: Payer: Self-pay | Admitting: Family Medicine

## 2020-04-04 DIAGNOSIS — Z1231 Encounter for screening mammogram for malignant neoplasm of breast: Secondary | ICD-10-CM

## 2020-05-29 ENCOUNTER — Ambulatory Visit: Payer: BC Managed Care – PPO

## 2020-07-06 ENCOUNTER — Ambulatory Visit
Admission: RE | Admit: 2020-07-06 | Discharge: 2020-07-06 | Disposition: A | Discharge status: Home / Self Care | Payer: BC Managed Care – PPO | Source: Ambulatory Visit | Attending: Family Medicine | Admitting: Family Medicine

## 2020-07-06 ENCOUNTER — Other Ambulatory Visit: Payer: Self-pay

## 2020-07-06 DIAGNOSIS — Z1231 Encounter for screening mammogram for malignant neoplasm of breast: Secondary | ICD-10-CM

## 2021-04-10 ENCOUNTER — Other Ambulatory Visit: Payer: Self-pay | Admitting: Family Medicine

## 2021-04-10 DIAGNOSIS — E2839 Other primary ovarian failure: Secondary | ICD-10-CM

## 2021-06-25 ENCOUNTER — Other Ambulatory Visit: Payer: Self-pay | Admitting: Family Medicine

## 2021-06-25 DIAGNOSIS — Z1231 Encounter for screening mammogram for malignant neoplasm of breast: Secondary | ICD-10-CM

## 2021-07-11 ENCOUNTER — Ambulatory Visit
Admission: RE | Admit: 2021-07-11 | Discharge: 2021-07-11 | Disposition: A | Discharge status: Home / Self Care | Payer: BC Managed Care – PPO | Source: Ambulatory Visit | Attending: Family Medicine | Admitting: Family Medicine

## 2021-07-11 DIAGNOSIS — Z1231 Encounter for screening mammogram for malignant neoplasm of breast: Secondary | ICD-10-CM

## 2021-08-15 ENCOUNTER — Inpatient Hospital Stay: Admission: RE | Admit: 2021-08-15 | Payer: BC Managed Care – PPO | Source: Ambulatory Visit

## 2021-08-15 ENCOUNTER — Ambulatory Visit
Admission: RE | Admit: 2021-08-15 | Discharge: 2021-08-15 | Disposition: A | Discharge status: Home / Self Care | Payer: BC Managed Care – PPO | Source: Ambulatory Visit | Attending: Family Medicine | Admitting: Family Medicine

## 2021-08-15 DIAGNOSIS — E2839 Other primary ovarian failure: Secondary | ICD-10-CM

## 2022-09-03 ENCOUNTER — Other Ambulatory Visit: Payer: Self-pay | Admitting: Family Medicine

## 2022-09-03 DIAGNOSIS — Z1231 Encounter for screening mammogram for malignant neoplasm of breast: Secondary | ICD-10-CM

## 2022-10-10 ENCOUNTER — Ambulatory Visit: Payer: BC Managed Care – PPO

## 2022-11-06 ENCOUNTER — Ambulatory Visit
Admission: RE | Admit: 2022-11-06 | Discharge: 2022-11-06 | Disposition: A | Discharge status: Home / Self Care | Payer: BC Managed Care – PPO | Source: Ambulatory Visit | Attending: Family Medicine | Admitting: Family Medicine

## 2022-11-06 DIAGNOSIS — Z1231 Encounter for screening mammogram for malignant neoplasm of breast: Secondary | ICD-10-CM

## 2023-09-24 ENCOUNTER — Other Ambulatory Visit: Payer: Self-pay

## 2023-09-25 LAB — SURGICAL PATHOLOGY

## 2023-10-02 ENCOUNTER — Ambulatory Visit: Admitting: Podiatry

## 2023-10-02 ENCOUNTER — Encounter: Payer: Self-pay | Admitting: Podiatry

## 2023-10-02 DIAGNOSIS — L600 Ingrowing nail: Secondary | ICD-10-CM

## 2023-10-02 MED ORDER — NEOMYCIN-POLYMYXIN-HC 3.5-10000-1 OT SOLN: OTIC | 0 refills | Status: AC

## 2023-10-02 NOTE — Patient Instructions (Signed)

## 2023-10-02 NOTE — Progress Notes (Signed)
  Subjective:  Patient ID: Phyllis Kim, female    DOB: 04-12-1961,  MRN: [id number] HPI Chief Complaint  Patient presents with   Ingrown Toenail    RM#6 Left foot big toe nil ingrown symptoms started 3 weeks ago using antibiotic ointment.    63 y.o. female presents with the above complaint.   ROS: Denies fever chills nausea vomiting muscle aches pains calf pain back pain chest pain shortness of breath.  Past Medical History:  Diagnosis Date   Dysfunctional uterine bleeding    Dysplasia of cervix, low grade (CIN 1)    Elective abortion    ONE   Lymphadenitis    Menometrorrhagia    Psoriasis    Vaginal delivery    3 NSVD'S   Past Surgical History:  Procedure Laterality Date   CRYOABLATION     ENDOMETRIAL ABLATION  06/01/10   HER OPTION ENDO ABLATION   MOUTH SURGERY     PELVIC LAPAROSCOPY     DIAG LAP X 2 FOR ENDOMETRIOSIS    Current Outpatient Medications:    ibuprofen (ADVIL,MOTRIN) 600 MG tablet, Take 600 mg by mouth every 6 (six) hours as needed., Disp: , Rfl:    neomycin-polymyxin-hydrocortisone (CORTISPORIN) OTIC solution, Apply one to two drops to toe after soaking twice daily., Disp: 10 mL, Rfl: 0  Allergies  Allergen Reactions   Amoxicillin    Review of Systems Objective:  There were no vitals filed for this visit.  General: Well developed, nourished, in no acute distress, alert and oriented x3   Dermatological: Skin is warm, dry and supple bilateral. Nails x 10 are well maintained; remaining integument appears unremarkable at this time. There are no open sores, no preulcerative lesions, no rash or signs of infection present.  Vascular: Dorsalis Pedis artery and Posterior Tibial artery pedal pulses are 2/4 bilateral with immedate capillary fill time. Pedal hair growth present. No varicosities and no lower extremity edema present bilateral.   Neruologic: Grossly intact via light touch bilateral. Vibratory intact via tuning fork bilateral. Protective threshold  with Semmes Wienstein monofilament intact to all pedal sites bilateral. Patellar and Achilles deep tendon reflexes 2+ bilateral. No Babinski or clonus noted bilateral.   Musculoskeletal: No gross boney pedal deformities bilateral. No pain, crepitus, or limitation noted with foot and ankle range of motion bilateral. Muscular strength 5/5 in all groups tested bilateral.  Gait: Unassisted, Nonantalgic.    Radiographs:  None taken  Assessment & Plan:   Assessment: Ingrown toenail tibial-fibular border the hallux left  Plan: Chemical matricectomy was performed today after local anesthetic was administered.  Tibial-fibular border of the hallux left was sharply resected exposing the nail matrix and nailbed.  These areas were then destroyed with phenol 30 seconds each with 3 applications.  He was neutralized with aspect alcohol.  She was given both oral and written home-going instructions for care and soaking of the toes as well as a prescription for Cortisporin Otic to be applied twice daily after soaking.     Majel Giel T. [place], [place]

## 2023-10-16 ENCOUNTER — Ambulatory Visit: Admitting: Podiatry

## 2023-12-08 ENCOUNTER — Other Ambulatory Visit: Payer: Self-pay | Admitting: Family Medicine

## 2023-12-08 DIAGNOSIS — Z1231 Encounter for screening mammogram for malignant neoplasm of breast: Secondary | ICD-10-CM

## 2023-12-28 IMAGING — MG MM DIGITAL SCREENING BILAT W/ TOMO AND CAD
8 series · 8 of 24 positions shown · non-contrast
Comparison: Previous exam(s).

CLINICAL DATA: Screening.

EXAM:
DIGITAL SCREENING BILATERAL MAMMOGRAM WITH TOMOSYNTHESIS AND CAD
TECHNIQUE: Bilateral screening digital craniocaudal and mediolateral oblique
mammograms were obtained. Bilateral screening digital breast
tomosynthesis was performed. The images were evaluated with
computer-aided detection.

[R CC synth-2D]
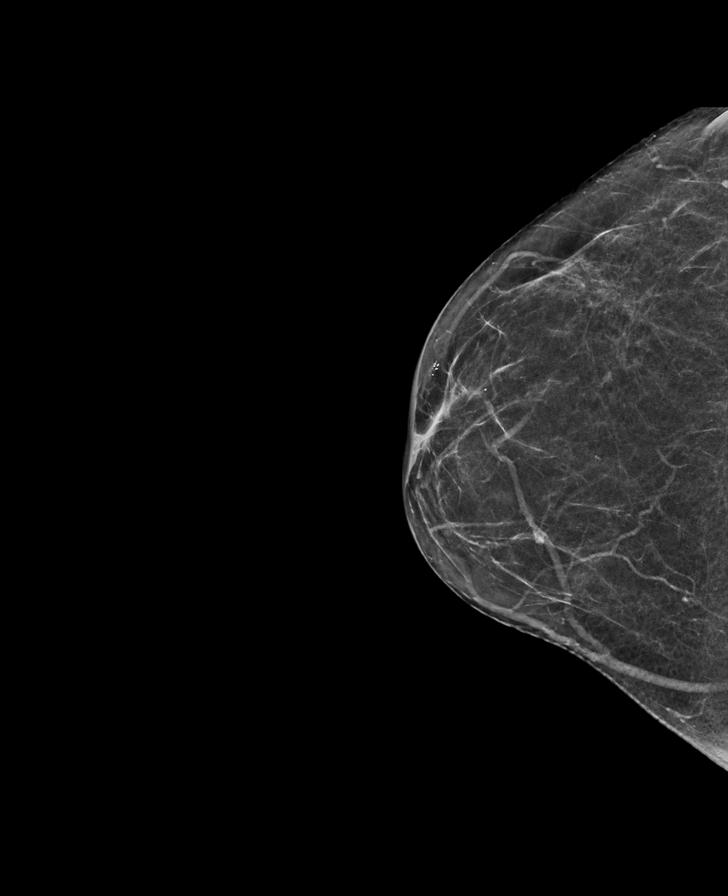

[L CC synth-2D]
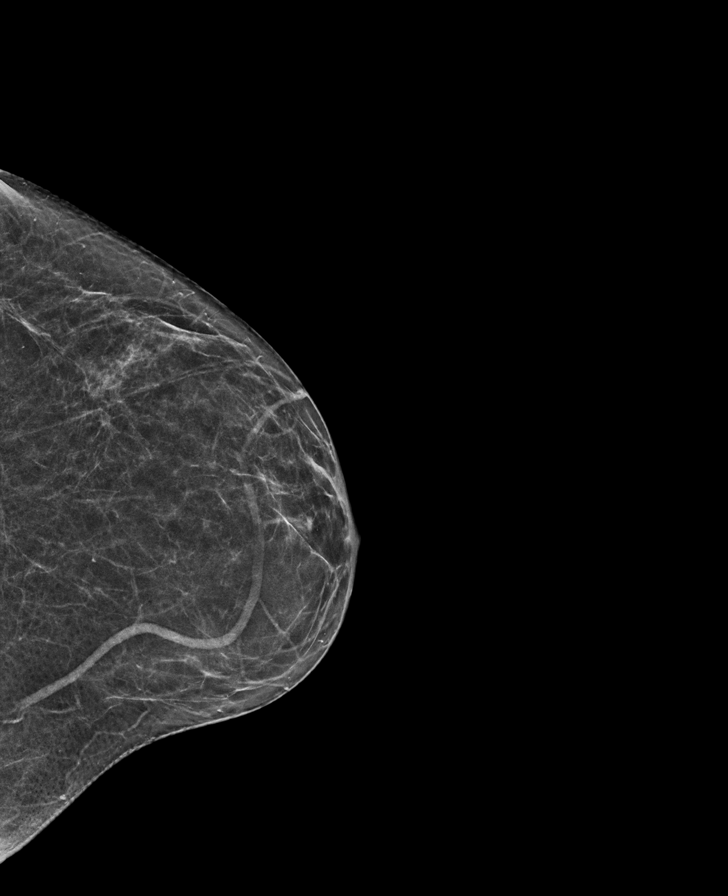

[R MLO synth-2D]
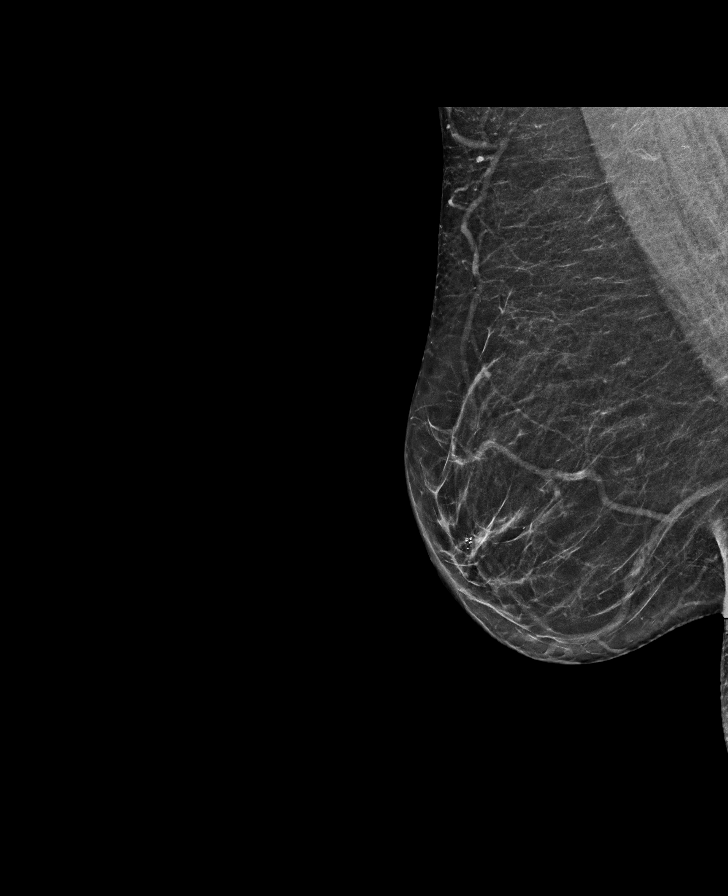

[L MLO synth-2D]
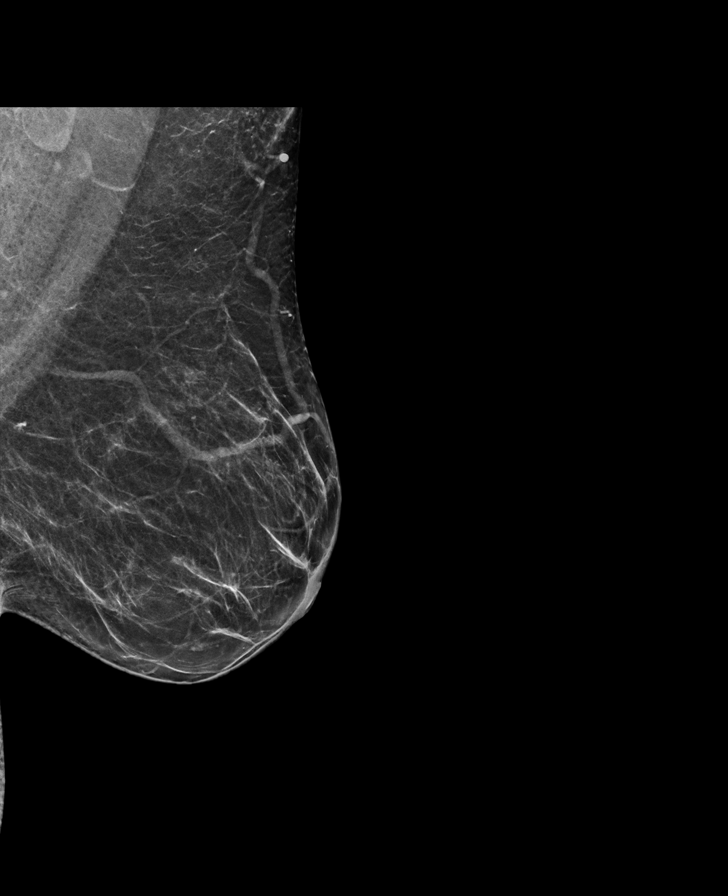

[R MLO tomo · tomo slice 33/64.0]
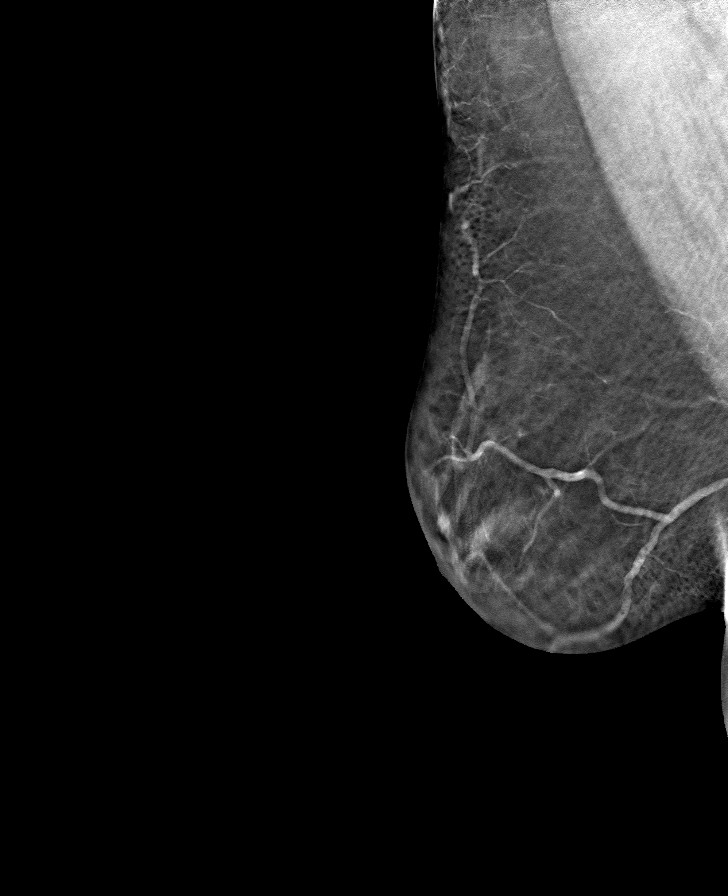

[L CC tomo · tomo slice 28/55.0]
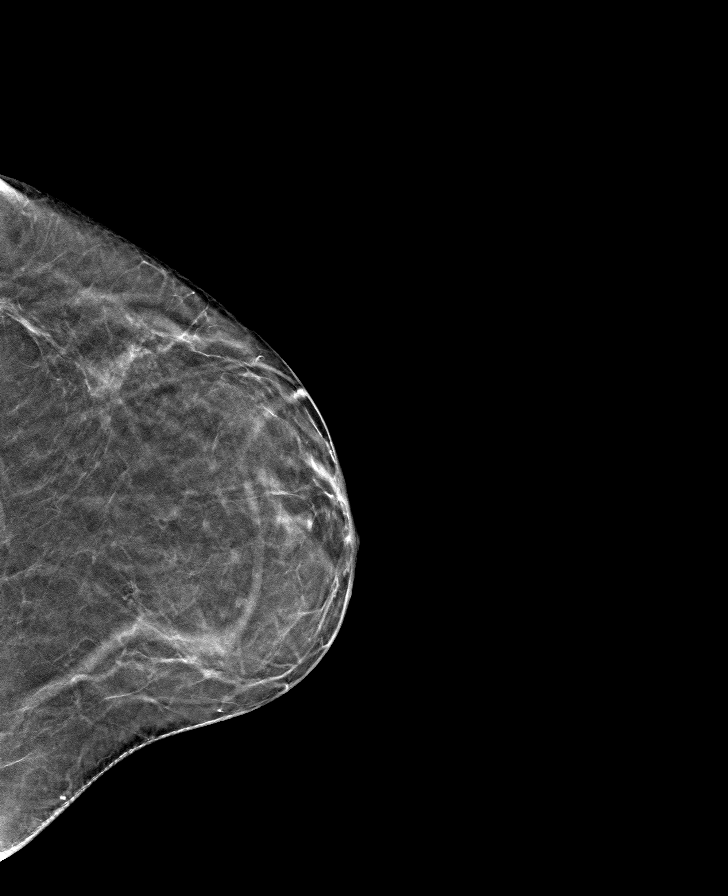

[R CC tomo · tomo slice 29/56.0]
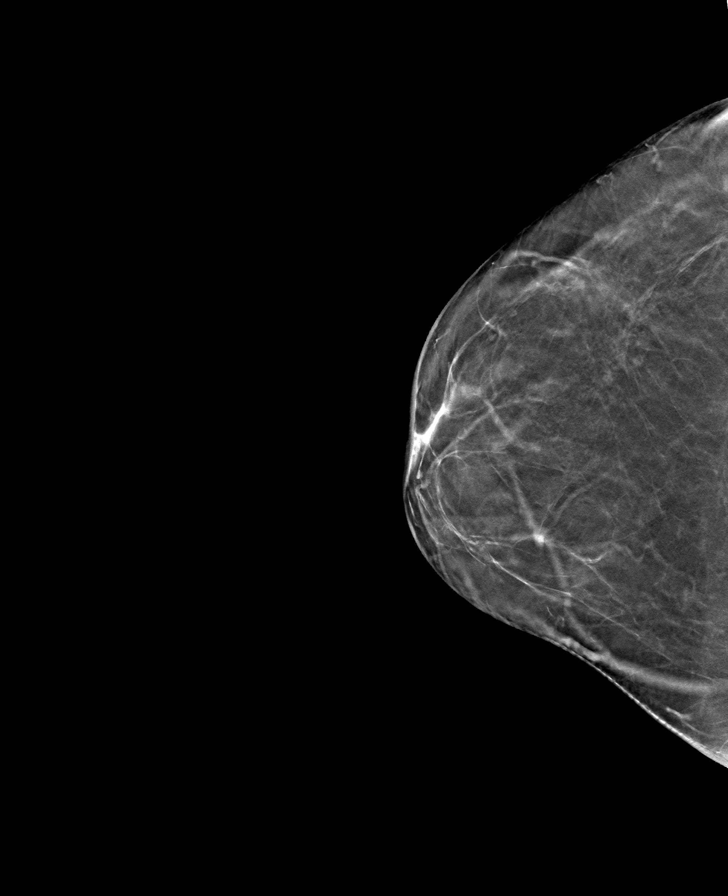

[L MLO tomo · tomo slice 35/68.0]
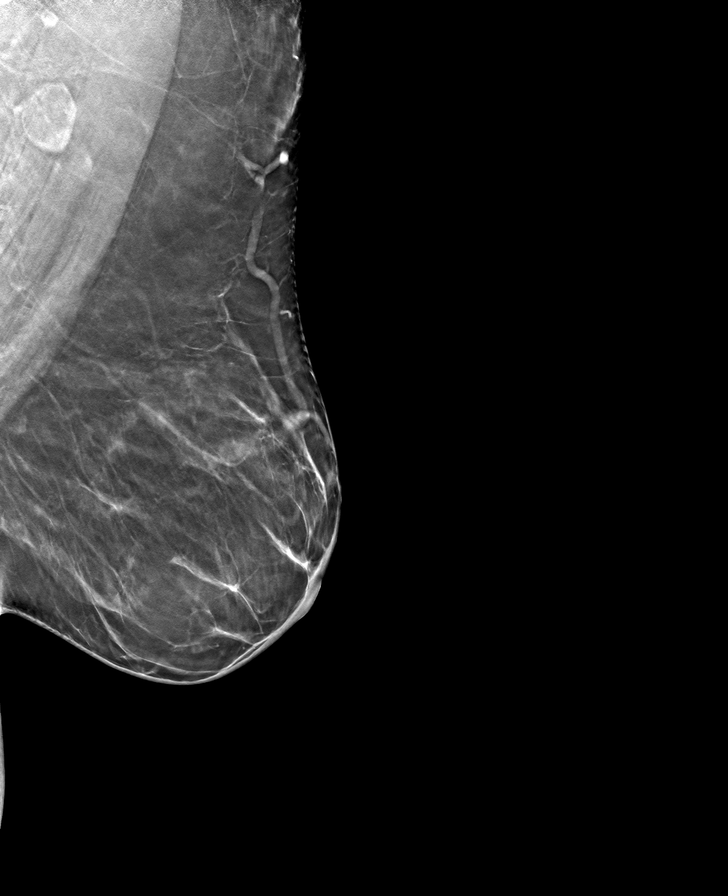

[8 of 24 positions shown; findings below may reference images not displayed]

ACR Breast Density Category b: There are scattered areas of
fibroglandular density.
FINDINGS: There are no findings suspicious for malignancy.
IMPRESSION: No mammographic evidence of malignancy. A result letter of this
screening mammogram will be mailed directly to the patient.

RECOMMENDATION:
Screening mammogram in one year. (Code:51-O-LD2)

BI-RADS CATEGORY  1: Negative.

## 2024-01-08 ENCOUNTER — Ambulatory Visit: Payer: Self-pay

## 2024-01-16 ENCOUNTER — Ambulatory Visit
Admission: RE | Admit: 2024-01-16 | Discharge: 2024-01-16 | Disposition: A | Discharge status: Home / Self Care | Payer: Self-pay | Source: Ambulatory Visit | Attending: Family Medicine | Admitting: Family Medicine

## 2024-01-16 DIAGNOSIS — Z1231 Encounter for screening mammogram for malignant neoplasm of breast: Secondary | ICD-10-CM

## 2024-04-12 ENCOUNTER — Other Ambulatory Visit: Payer: Self-pay

## 2024-04-12 DIAGNOSIS — R911 Solitary pulmonary nodule: Secondary | ICD-10-CM

## 2024-04-12 DIAGNOSIS — D860 Sarcoidosis of lung: Secondary | ICD-10-CM

## 2024-04-23 ENCOUNTER — Inpatient Hospital Stay: Admission: RE | Admit: 2024-04-23 | Discharge: 2024-04-23

## 2024-04-23 DIAGNOSIS — D860 Sarcoidosis of lung: Secondary | ICD-10-CM

## 2024-04-23 DIAGNOSIS — R911 Solitary pulmonary nodule: Secondary | ICD-10-CM

## 2024-05-11 ENCOUNTER — Other Ambulatory Visit (HOSPITAL_BASED_OUTPATIENT_CLINIC_OR_DEPARTMENT_OTHER): Payer: Self-pay | Admitting: Family Medicine

## 2024-05-11 DIAGNOSIS — E041 Nontoxic single thyroid nodule: Secondary | ICD-10-CM
